# Patient Record
Sex: Female | Born: 1990 | Race: White | Hispanic: No | Marital: Married | Smoking: Former smoker
Health system: Southern US, Community
[De-identification: ages and names within clinical notes are randomized; demographics above are authoritative.]

## PROBLEM LIST (undated history)

## (undated) DIAGNOSIS — D649 Anemia, unspecified: Secondary | ICD-10-CM

## (undated) DIAGNOSIS — F329 Major depressive disorder, single episode, unspecified: Secondary | ICD-10-CM

## (undated) DIAGNOSIS — F32A Depression, unspecified: Secondary | ICD-10-CM

## (undated) DIAGNOSIS — U071 COVID-19: Secondary | ICD-10-CM

## (undated) DIAGNOSIS — I471 Supraventricular tachycardia: Secondary | ICD-10-CM

## (undated) DIAGNOSIS — Z789 Other specified health status: Secondary | ICD-10-CM

## (undated) HISTORY — PX: ABLATION: SHX5711

## (undated) HISTORY — DX: Other specified health status: Z78.9

## (undated) HISTORY — DX: Supraventricular tachycardia: I47.1

---
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
## 2015-08-08 DIAGNOSIS — I471 Supraventricular tachycardia, unspecified: Secondary | ICD-10-CM

## 2015-08-08 HISTORY — DX: Supraventricular tachycardia: I47.1

## 2015-08-08 HISTORY — PX: CHOLECYSTECTOMY: SHX55

## 2015-08-08 HISTORY — DX: Supraventricular tachycardia, unspecified: I47.10

## 2017-01-25 DIAGNOSIS — O365990 Maternal care for other known or suspected poor fetal growth, unspecified trimester, not applicable or unspecified: Secondary | ICD-10-CM

## 2018-08-07 NOTE — L&D Delivery Note (Addendum)
Delivery Note At 10:26 AM a viable and healthy female was delivered via Vaginal, Spontaneous (Presentation: LOA ).  APGAR: 9, 9; weight per infant chart Placenta status: delivered spontaneously intact  Cord: 3-vessel  Anesthesia:Epidural Episiotomy: None Lacerations:  1st degree perineal Suture Repair: 3.0 vicryl Est. Blood Loss (mL):  25  Mom to postpartum.  Baby to Couplet care / Skin to Skin.  Arby Barrette E Driver 0/27/2536, 64:40 AM  OB FELLOW DELIVERY ATTESTATION  I was gloved and present for the delivery in its entirety, and I agree with the above resident's note.    Barrington Ellison, MD [facility] Family Medicine Fellow, [facility] for [employer], [place]

## 2018-12-02 ENCOUNTER — Encounter: Payer: Self-pay | Admitting: Obstetrics and Gynecology

## 2018-12-02 ENCOUNTER — Encounter: Payer: Self-pay | Admitting: Radiology

## 2018-12-02 ENCOUNTER — Other Ambulatory Visit (HOSPITAL_COMMUNITY)
Admission: RE | Admit: 2018-12-02 | Discharge: 2018-12-02 | Disposition: A | Discharge status: Home / Self Care | Payer: Medicaid Other | Source: Ambulatory Visit | Attending: Obstetrics and Gynecology | Admitting: Obstetrics and Gynecology

## 2018-12-02 ENCOUNTER — Encounter: Payer: Self-pay | Admitting: *Deleted

## 2018-12-02 ENCOUNTER — Ambulatory Visit (INDEPENDENT_AMBULATORY_CARE_PROVIDER_SITE_OTHER): Payer: Medicaid Other | Admitting: Obstetrics and Gynecology

## 2018-12-02 ENCOUNTER — Other Ambulatory Visit: Payer: Self-pay

## 2018-12-02 VITALS — BP 124/78 | HR 116 | Ht 67.0 in | Wt 139.0 lb

## 2018-12-02 DIAGNOSIS — M249 Joint derangement, unspecified: Secondary | ICD-10-CM | POA: Insufficient documentation

## 2018-12-02 DIAGNOSIS — B3731 Acute candidiasis of vulva and vagina: Secondary | ICD-10-CM

## 2018-12-02 DIAGNOSIS — Z3480 Encounter for supervision of other normal pregnancy, unspecified trimester: Secondary | ICD-10-CM

## 2018-12-02 DIAGNOSIS — Z800 Family history of malignant neoplasm of digestive organs: Secondary | ICD-10-CM | POA: Insufficient documentation

## 2018-12-02 DIAGNOSIS — Z3A16 16 weeks gestation of pregnancy: Secondary | ICD-10-CM | POA: Diagnosis not present

## 2018-12-02 DIAGNOSIS — Z3482 Encounter for supervision of other normal pregnancy, second trimester: Secondary | ICD-10-CM | POA: Diagnosis not present

## 2018-12-02 DIAGNOSIS — B373 Candidiasis of vulva and vagina: Secondary | ICD-10-CM

## 2018-12-02 DIAGNOSIS — Z87898 Personal history of other specified conditions: Secondary | ICD-10-CM | POA: Insufficient documentation

## 2018-12-02 MED ORDER — CLOTRIMAZOLE 1 % EX OINT: TOPICAL_OINTMENT | CUTANEOUS | 1 refills | Status: DC

## 2018-12-02 MED ORDER — METRONIDAZOLE 500 MG PO TABS: 500.0000 mg | ORAL_TABLET | Freq: Two times a day (BID) | ORAL | 0 refills | Status: AC

## 2018-12-02 NOTE — Progress Notes (Signed)
Erroneous encounter

## 2018-12-02 NOTE — Progress Notes (Signed)
Moved her from [place] few months ago Last Pap 2018-normal

## 2018-12-02 NOTE — Patient Instructions (Signed)
Start taking a quarter cup of Kefir right before bed.

## 2018-12-02 NOTE — Progress Notes (Signed)
New OB Note  12/02/2018   [facility]  Chief Complaint: NOB  Transfer of Care Patient: no  History of Present Illness: [name] is a 28 y.o. G3P1011 @ 16/6 weeks (EDC 106, based on Patient's last menstrual period was 08/06/2018 (exact date).=6wk u/s).  Preg complicated by has Family history of Lynch syndrome; Hypermobile joints; History of poor fetal growth; and Supervision of high risk pregnancy, antepartum on their problem list.   Any events prior to today's visit: no Her periods were: regular, qmonth She was using no method when she conceived.  She has Negative signs or symptoms of nausea/vomiting of pregnancy. She has Negative signs or symptoms of miscarriage or preterm labor  ROS: A 12-point review of systems was performed and negative, except as stated in the above HPI.  OBGYN History: As per HPI. OB History  Gravida Para Term Preterm AB Living  3 1 1   1 1   SAB TAB Ectopic Multiple Live Births  1       1    # Outcome Date GA Lbr Len/2nd Weight Sex Delivery Anes PTL Lv  3 Current           2 Term 01/25/17 3923w0d  5 lb 14 oz (2.665 kg) F Vag-Spont   LIV     Complications: IUGR (intrauterine growth restriction) affecting care of mother  1 SAB 12/2015            Any issues with any prior pregnancies: yes Prior children are healthy, doing well, and without any problems or issues: yes History of pap smears: Yes. Last pap smear ?2018 and results were negative   Past Medical History: Past Medical History:  Diagnosis Date  . Medical history non-contributory   . SVT (supraventricular tachycardia) (HCC) 2017    Past Surgical History: Past Surgical History:  Procedure Laterality Date  . ABLATION     SVT  . CHOLECYSTECTOMY  2017    Family History:  History reviewed. No pertinent family history. She denies any history of mental retardation, birth defects or genetic disorders in her or the FOB's history  Social History:  Social  History   Socioeconomic History  . Marital status: Married    Spouse name: Not on file  . Number of children: Not on file  . Years of education: Not on file  . Highest education level: Not on file  Occupational History  . Not on file  Social Needs  . Financial resource strain: Not on file  . Food insecurity:    Worry: Not on file    Inability: Not on file  . Transportation needs:    Medical: Not on file    Non-medical: Not on file  Tobacco Use  . Smoking status: Never Smoker  . Smokeless tobacco: Never Used  Substance and Sexual Activity  . Alcohol use: Not Currently  . Drug use: Not Currently  . Sexual activity: Yes    Birth control/protection: None  Lifestyle  . Physical activity:    Days per week: Not on file    Minutes per session: Not on file  . Stress: Not on file  Relationships  . Social connections:    Talks on phone: Not on file    Gets together: Not on file    Attends religious service: Not on file    Active member of club or organization: Not on file    Attends meetings of clubs or organizations: Not on file  Relationship status: Not on file  . Intimate partner violence:    Fear of current or ex partner: Not on file    Emotionally abused: Not on file    Physically abused: Not on file    Forced sexual activity: Not on file  Other Topics Concern  . Not on file  Social History Narrative  . Not on file    Allergy: No Known Allergies  Health Maintenance:  Mammogram Up to Date: not applicable  Current Outpatient Medications: PNV  Physical Exam:   BP 124/78   Pulse (!) 116   Ht 5\' 7"  (1.702 m)   Wt 139 lb (63 kg)   LMP 08/06/2018 (Exact Date)   BMI 21.77 kg/m  Body mass index is 21.77 kg/m. Contractions: Not present Vag. Bleeding: None. Fundal height: not applicable FHTs: 160s  General appearance: Well nourished, well developed female in no acute distress.  Neck:  Supple, normal appearance, and no thyromegaly  Cardiovascular: S1, S2  normal, no murmur, rub or gallop, regular rate and rhythm Respiratory:  Clear to auscultation bilateral. Normal respiratory effort Abdomen: positive bowel sounds and no masses, hernias; diffusely non tender to palpation, non distended Breasts: no breast s/s. Neuro/Psych:  Normal mood and affect.  Skin:  Warm and dry.  Lymphatic:  No inguinal lymphadenopathy.   Pelvic exam: is not limited by body habitus EGBUS: within normal limits, Vagina: within normal limits and with no blood in the vault, Cervix: normal appearing cervix without discharge or lesions, closed/long/high, Uterus:  enlarged, c/w 16-18 week size, and Adnexa:  normal adnexa and no mass, fullness, tenderness  Laboratory: none  Imaging:  none  Assessment: pt doing well  Plan: 1. Supervision of other normal pregnancy, antepartum Routine care. U/s per patient report pregnancy care center. F/u with anatomy u/s.  - Obstetric Panel, Including HIV - Culture, OB Urine - Enroll Patient in Babyscripts - Babyscripts Schedule Optimization - [place] MFM OB DETAIL +14 WK; Future - Cytology - PAP( [place]) - Ambulatory referral to Physical Therapy - Hepatitis C Antibody  2. Family history of Lynch syndrome Patient states she tested negative  3. Hypermobile joints Mostly hips and shoulders. patient states she was told that she doesn't have marfan's or EDS earlier in life. She states she needed PT throughout her last pregnancy. Referral sent for patient to establish care with PT  4. History of poor fetal growth Recommend at least starting growth u/s at 32-34wks  2665gm 38wk delivery. Sounds like she had FGR with abnormal dopplers as indication for delivery.   5. Vulvovaginal candidiasis Ointment sent in  Problem list reviewed and updated.  Follow up in 4 weeks.  >50% of 25 min visit spent on counseling and coordination of care.     Cornelia Copa MD Attending Center for [facility] PT  during pregnancy

## 2018-12-03 LAB — OBSTETRIC PANEL, INCLUDING HIV
Antibody Screen: NEGATIVE
Basophils Absolute: 0 10*3/uL (ref 0.0–0.2)
Basos: 0 %
EOS (ABSOLUTE): 0 10*3/uL (ref 0.0–0.4)
Eos: 0 %
HIV Screen 4th Generation wRfx: NONREACTIVE
Hematocrit: 39 % (ref 34.0–46.6)
Hemoglobin: 13.4 g/dL (ref 11.1–15.9)
Hepatitis B Surface Ag: NEGATIVE
Immature Grans (Abs): 0 10*3/uL (ref 0.0–0.1)
Immature Granulocytes: 0 %
Lymphocytes Absolute: 1.3 10*3/uL (ref 0.7–3.1)
Lymphs: 15 %
MCH: 30.5 pg (ref 26.6–33.0)
MCHC: 34.4 g/dL (ref 31.5–35.7)
MCV: 89 fL (ref 79–97)
Monocytes Absolute: 0.5 10*3/uL (ref 0.1–0.9)
Monocytes: 5 %
Neutrophils Absolute: 7.1 10*3/uL — ABNORMAL HIGH (ref 1.4–7.0)
Neutrophils: 80 %
Platelets: 221 10*3/uL (ref 150–450)
RBC: 4.39 x10E6/uL (ref 3.77–5.28)
RDW: 12.5 % (ref 11.7–15.4)
RPR Ser Ql: NONREACTIVE
Rh Factor: POSITIVE
Rubella Antibodies, IGG: 5.61 index (ref >0.99)
WBC: 9 10*3/uL (ref 3.4–10.8)

## 2018-12-03 LAB — CYTOLOGY - PAP
Chlamydia: NEGATIVE
Diagnosis: NEGATIVE
Neisseria Gonorrhea: NEGATIVE
Trichomonas: NEGATIVE

## 2018-12-03 LAB — HEPATITIS C ANTIBODY: Hep C Virus Ab: 0.2 s/co ratio (ref 0.0–0.9)

## 2018-12-04 ENCOUNTER — Telehealth: Payer: Self-pay | Admitting: *Deleted

## 2018-12-04 NOTE — Telephone Encounter (Signed)
Received fax from pharmacy that alevazol ointment is unavailable/backorder. Per Dr Vergie Living okay to switch to one that is available. RX called in  Tioconazole ointment 1 tube apply bid x 7 days, and one refill.

## 2018-12-05 ENCOUNTER — Encounter: Payer: Self-pay | Admitting: Obstetrics and Gynecology

## 2018-12-05 DIAGNOSIS — O0990 Supervision of high risk pregnancy, unspecified, unspecified trimester: Secondary | ICD-10-CM | POA: Insufficient documentation

## 2018-12-07 LAB — CULTURE, OB URINE

## 2018-12-07 LAB — URINE CULTURE, OB REFLEX

## 2018-12-09 ENCOUNTER — Encounter: Payer: Self-pay | Admitting: Obstetrics and Gynecology

## 2018-12-09 ENCOUNTER — Telehealth: Payer: Self-pay | Admitting: *Deleted

## 2018-12-09 DIAGNOSIS — R8271 Bacteriuria: Secondary | ICD-10-CM | POA: Insufficient documentation

## 2018-12-09 MED ORDER — NITROFURANTOIN MONOHYD MACRO 100 MG PO CAPS: ORAL_CAPSULE | ORAL | 0 refills | Status: DC

## 2018-12-09 NOTE — Addendum Note (Signed)
Addended by: [employer] on: 12/09/2018 09:38 AM   Modules accepted: Orders

## 2018-12-09 NOTE — Telephone Encounter (Signed)
Pt informed of results and RX being sent in.

## 2018-12-09 NOTE — Telephone Encounter (Signed)
-----   Message from [employer], MD sent at 12/09/2018  9:39 AM EDT ----- Can you let her know that I sent in an abx for her UTI? thanks

## 2018-12-23 ENCOUNTER — Other Ambulatory Visit (HOSPITAL_COMMUNITY): Payer: Self-pay | Admitting: *Deleted

## 2018-12-23 ENCOUNTER — Ambulatory Visit (HOSPITAL_COMMUNITY)
Admission: RE | Admit: 2018-12-23 | Discharge: 2018-12-23 | Disposition: A | Discharge status: Home / Self Care | Payer: Medicaid Other | Source: Ambulatory Visit | Attending: Obstetrics and Gynecology | Admitting: Obstetrics and Gynecology

## 2018-12-23 ENCOUNTER — Other Ambulatory Visit: Payer: Self-pay

## 2018-12-23 DIAGNOSIS — Z3480 Encounter for supervision of other normal pregnancy, unspecified trimester: Secondary | ICD-10-CM | POA: Diagnosis present

## 2018-12-23 DIAGNOSIS — Z3A19 19 weeks gestation of pregnancy: Secondary | ICD-10-CM

## 2018-12-23 DIAGNOSIS — Z363 Encounter for antenatal screening for malformations: Secondary | ICD-10-CM | POA: Diagnosis not present

## 2018-12-23 DIAGNOSIS — Z8489 Family history of other specified conditions: Secondary | ICD-10-CM | POA: Diagnosis not present

## 2018-12-23 DIAGNOSIS — O09292 Supervision of pregnancy with other poor reproductive or obstetric history, second trimester: Secondary | ICD-10-CM | POA: Diagnosis not present

## 2018-12-23 DIAGNOSIS — O365930 Maternal care for other known or suspected poor fetal growth, third trimester, not applicable or unspecified: Secondary | ICD-10-CM

## 2018-12-25 ENCOUNTER — Ambulatory Visit: Payer: Medicaid Other | Attending: Obstetrics and Gynecology | Admitting: Physical Therapy

## 2018-12-25 ENCOUNTER — Other Ambulatory Visit: Payer: Self-pay

## 2018-12-25 ENCOUNTER — Encounter: Payer: Self-pay | Admitting: Physical Therapy

## 2018-12-25 DIAGNOSIS — R279 Unspecified lack of coordination: Secondary | ICD-10-CM | POA: Insufficient documentation

## 2018-12-25 DIAGNOSIS — M6281 Muscle weakness (generalized): Secondary | ICD-10-CM | POA: Diagnosis present

## 2018-12-25 NOTE — Patient Instructions (Signed)
Access Code: [email or web address]  URL: [email or web address]  Date: 12/25/2018  Prepared by: Dwana Curd   Exercises  Quadruped Alternating Arm Lift - 10 reps - 2 sets - 1x daily - 7x weekly

## 2018-12-25 NOTE — Therapy (Signed)
[facility] [address] [place], [place], [address] Phone: [phone]   Fax:  [phone]  Physical Therapy Evaluation  Patient Details  Name: Theresa Johnston MRN: [id number] Date of Birth: Apr 27, 1991 Referring Provider (PT): Aletha Halim, MD   Encounter Date: 12/25/2018  PT End of Session - 12/25/18 0921    Visit Number  1    Date for PT Re-Evaluation  03/19/19    Authorization Type  medicaid    PT Start Time  0915    PT Stop Time  0949    PT Time Calculation (min)  34 min    Activity Tolerance  Patient tolerated treatment well    Behavior During Therapy  [facility] for tasks assessed/performed       Past Medical History:  Diagnosis Date  . Medical history non-contributory   . SVT (supraventricular tachycardia) ([place]) 2017    Past Surgical History:  Procedure Laterality Date  . ABLATION     SVT  . CHOLECYSTECTOMY  2017    There were no vitals filed for this visit.   Subjective Assessment - 12/25/18 0930    Subjective  I have loose joints and had to do exercises througout my entire pregnancy to hold everything together.    Limitations  Walking;Standing;Lifting    Patient Stated Goals  be able to have a healthy pregnancy, reduced low back pain    Currently in Pain?  Yes    Pain Score  6    when doing a lot 1-2 days/week   Pain Location  Back    Pain Orientation  Lower    Pain Descriptors / Indicators  Sharp;Aching    Pain Type  Acute pain;Chronic pain   very rarely had it, but more since pregnant   Pain Radiating Towards  into the hips    Pain Onset  More than a month ago    Pain Frequency  Several days a week    Aggravating Factors   lifting and walking/standing a lot    Pain Relieving Factors  massage    Effect of Pain on Daily Activities  hurts more when doing more    Multiple Pain Sites  No         [facility] PT Assessment - 12/25/18 0001      Assessment   Medical Diagnosis  Z34.80  (ICD-10-CM) - Supervision of other normal pregnancy, antepartum; B37.3 (ICD-10-CM) - Vulvovaginal candidiasis    Referring Provider (PT)  Aletha Halim, MD    Onset Date/Surgical Date  --   20 weeks ago   Prior Therapy  yes when pregnant with her daughter      Precautions   Precautions  None      Restrictions   Weight Bearing Restrictions  No      Balance Screen   Has the patient fallen in the past 6 months  No      [place] residence    Living Arrangements  Spouse/significant other;Children   daughter 2 yo     Prior Function   Level of Independence  Independent      Cognition   Overall Cognitive Status  Within Functional Limits for tasks assessed      Posture/Postural Control   Posture/Postural Control  Postural limitations    Postural Limitations  Decreased lumbar lordosis;Right pelvic obliquity;Anterior pelvic tilt      ROM / Strength   AROM / PROM / Strength  AROM;Strength  Strength   Overall Strength Comments  core 4/5 strength    Strength Assessment Site  Hip    Right/Left Hip  Right;Left    Right Hip Extension  4/5    Right Hip ABduction  5/5    Right Hip ADduction  4+/5    Left Hip Extension  4+/5    Left Hip ADduction  4-/5      Flexibility   Soft Tissue Assessment /Muscle Length  yes    Hamstrings  10% limited      Palpation   Palpation comment  left SI joint tenderness      Special Tests    Special Tests  Lumbar    Lumbar Tests  Straight Leg Raise      Straight Leg Raise   Findings  --   some pain on left SI joint   Side   Left    Comment  easier and no pain with compression      Ambulation/Gait   Gait Pattern  Within Functional Limits                Objective measurements completed on examination: See above findings.    Pelvic Floor Special Questions - 12/25/18 0001    Prior Pelvic/Prostate Exam  Yes    Are you Pregnant or attempting pregnancy?  Yes   pregnant [redacted] weeks   Prior  Pregnancies  Yes    Number of Pregnancies  2    Number of Vaginal Deliveries  1    Any difficulty with labor and deliveries  No    Currently Sexually Active  Yes    Is this Painful  Yes    Marinoff Scale  pain interrupts completion    Urinary Leakage  Yes    How often  daily - very small amount    Pad use  panty liner    Activities that cause leaking  Coughing;Sneezing;Laughing    Urinary urgency  No    Falling out feeling (prolapse)  No    Exam Type  Deferred       [facility] Adult PT Treatment/Exercise - 12/25/18 0001      Self-Care   Self-Care  Other Self-Care Comments    Other Self-Care Comments   initial HEP             PT Education - 12/25/18 [place]    Education Details   Access Code: [email or web address]     Person(s) Educated  Patient    Methods  Explanation;Demonstration;Verbal cues;Handout    Comprehension  Verbalized understanding;Returned demonstration       PT Short Term Goals - 12/25/18 0958      PT SHORT TERM GOAL #1   Title  ind with initial HEP    Time  4    Period  Weeks    Status  New    Target Date  01/22/19      PT SHORT TERM GOAL #2   Title  Pt will be able to correctly engage core and pelvic floor when lifting her daughter    Time  4    Period  Weeks    Status  New    Target Date  01/22/19        PT Long Term Goals - 12/25/18 0953      PT LONG TERM GOAL #1   Title  Pt will be ind with HEP for strengthening exercises for healthy pregnancy    Time  12    Period  Weeks  Status  New    Target Date  03/19/19      PT LONG TERM GOAL #2   Title  Pt will be able to take walks with her family without increased back pain due to improved strength and pain management techniques    Baseline  6/10 pain on NPRS    Time  12    Period  Weeks    Status  New    Target Date  03/19/19      PT LONG TERM GOAL #3   Title  Pt will report back pain of 4/10 at most even on days with increased activity    Baseline  6/10 on NPRS    Time  12    Period  Weeks     Status  Not Met    Target Date  03/19/19      PT LONG TERM GOAL #4   Title  Pt will be able to lift her daughter without low back pain due to improved ability to engage core and pelvic floor    Baseline  has back pain up to 6/10 on NPRS    Time  12    Period  Weeks    Status  New    Target Date  03/19/19             Plan - 12/25/18 1002    Clinical Impression Statement  Pt presents to clinic today at [redacted] weeks gestation.  she has ligament laxity condition that causes her hips and pelvis to become out of alignment.   Pt is needing a strengthening routine to minimize effects of pregnancy and ensure safe and healthy pregnancy.  Pt demonstrates LE weakness and core weakness.  She has posture abnormalities as mentioned above with sacral rotation to the Rt.  Pt will benefit from skilled PT to address impairments and reduce low back pain so she can continue to care for her family with less risk and continue healthy pregnancy.    Personal Factors and Comorbidities  Age;Comorbidity 2;Time since onset of injury/illness/exacerbation    Examination-Activity Limitations  Lift;Stand    Examination-Participation Restrictions  Other    Stability/Clinical Decision Making  Evolving/Moderate complexity    Rehab Potential  Excellent    PT Frequency  2x / week    PT Duration  12 weeks    PT Treatment/Interventions  ADLs/Self Care Home Management;Biofeedback;Therapeutic activities;Therapeutic exercise;Neuromuscular re-education;Patient/family education;Manual techniques;Passive range of motion;Taping;Electrical Stimulation;Moist Heat;Cryotherapy    PT Next Visit Plan  core and pelvic floor strength, adductor and hip strength    PT Home Exercise Plan   Access Code: [email or web address]     Consulted and Agree with Plan of Care  Patient       Patient will benefit from skilled therapeutic intervention in order to improve the following deficits and impairments:  Postural dysfunction, Difficulty walking, Decreased  coordination, Pain, Decreased strength  Visit Diagnosis: Muscle weakness (generalized)  Unspecified lack of coordination     Problem List Patient Active Problem List   Diagnosis Date Noted  . GBS bacteriuria 12/09/2018  . Supervision of high risk pregnancy, antepartum 12/05/2018  . Family history of Lynch syndrome 12/02/2018  . 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 History of poor fetal growth 12/02/2018    Jule Ser, PT 12/25/2018, 10:20 AM  [facility] [address], [place], [place], [address] Phone: [phone]   Fax:  [phone]  Name: Theresa Johnston MRN: [id number] Date of Birth: 12-07-1990

## 2018-12-31 ENCOUNTER — Ambulatory Visit (INDEPENDENT_AMBULATORY_CARE_PROVIDER_SITE_OTHER): Payer: Medicaid Other | Admitting: Family Medicine

## 2018-12-31 VITALS — BP 120/89 | Wt 141.0 lb

## 2018-12-31 DIAGNOSIS — Z3A21 21 weeks gestation of pregnancy: Secondary | ICD-10-CM

## 2018-12-31 DIAGNOSIS — O0992 Supervision of high risk pregnancy, unspecified, second trimester: Secondary | ICD-10-CM

## 2018-12-31 DIAGNOSIS — O0990 Supervision of high risk pregnancy, unspecified, unspecified trimester: Secondary | ICD-10-CM

## 2018-12-31 NOTE — Progress Notes (Signed)
TELEHEALTH OBSTETRICS PRENATAL VIRTUAL VIDEO VISIT ENCOUNTER NOTE  Provider location: Center for [facility] at [facility]   I connected with Theresa Johnston on 12/31/18 at  1:15 PM EDT by WebEx Encounter at home and verified that I am speaking with the correct person using two identifiers.   I discussed the limitations, risks, security and privacy concerns of performing an evaluation and management service by telephone and the availability of in person appointments. I also discussed with the patient that there may be a patient responsible charge related to this service. The patient expressed understanding and agreed to proceed. Subjective:  Theresa Johnston is a 28 y.o. G3P1011 at [redacted]w[redacted]d being seen today for ongoing prenatal care.  She is currently monitored for the following issues for this low-risk pregnancy and has Family history of Lynch syndrome; Hypermobile joints; History of poor fetal growth; Supervision of high risk pregnancy, antepartum; and GBS bacteriuria on their problem list.  Patient reports no complaints.  Contractions: Irregular.  .  Movement: Present. Denies any leaking of fluid.   The following portions of the patient's history were reviewed and updated as appropriate: allergies, current medications, past family history, past medical history, past social history, past surgical history and problem list.   Objective:   Vitals:   12/31/18 1339  BP: 120/89  Weight: 141 lb (64 kg)    Fetal Status:     Movement: Present     General:  Alert, oriented and cooperative. Patient is in no acute distress.  Respiratory: Normal respiratory effort, no problems with respiration noted  Mental Status: Normal mood and affect. Normal behavior. Normal judgment and thought content.  Rest of physical exam deferred due to type of encounter  Imaging: [place] Mfm Ob Detail +14 Wk  Result Date: 12/23/2018 ----------------------------------------------------------------------  OBSTETRICS  REPORT                       (Signed Final 12/23/2018 02:49 pm) ---------------------------------------------------------------------- Patient Info  ID #:       [id number]                          D.O.B.:  [birth year] (28 yrs)  Name:       Theresa Johnston                 Visit Date: 12/23/2018 08:22 am ---------------------------------------------------------------------- Performed By  Performed By:     Percell Boston          Ref. Address:      [address]                    [address]                                                              [address]  Attending:        Lin Landsman      [facility]  Referred By:      [facility] ---------------------------------------------------------------------- Orders   #  Description                          Code         Ordered By   1  [place] MFM OB DETAIL +14 WK              76811.01     [place]  ----------------------------------------------------------------------   #  Order #                    Accession #                 Episode #   1  [id number]                  [id number]                  [id number]  ---------------------------------------------------------------------- Indications   [redacted] weeks gestation of pregnancy                Z3A.19   Encounter for antenatal screening for          Z36.3   malformations (no testing in chart)   Poor obstetric history: Previous fetal growth  O09.299   restriction (FGR)   Family history of genetic disorder (lynch      Z84.89   syndrome and hypermobility)  ---------------------------------------------------------------------- Fetal Evaluation  Num Of Fetuses:          1  Fetal Heart Rate(bpm):   148  Cardiac Activity:        Observed  Presentation:            Cephalic  Placenta:                Anterior  P. Cord Insertion:       Visualized, central  Amniotic Fluid  AFI FV:      Within normal limits                              Largest  Pocket(cm)                              3.0 ---------------------------------------------------------------------- Biometry  BPD:      44.5  mm     G. Age:  19w 3d         33  %    CI:          74.3  %    70 - 86                                                          FL/HC:       18.1  %    16.8 - 19.8  HC:      163.9  mm     G. Age:  19w 1d         14  %    HC/AC:       1.15       1.09 - 1.39  AC:      142.9  mm  G. Age:  19w 4d         38  %    FL/BPD:      66.5  %  FL:       29.6  mm     G. Age:  19w 1d         19  %    FL/AC:       20.7  %    20 - 24  HUM:      29.1  mm     G. Age:  19w 3d         43  %  CER:      18.4  mm     G. Age:  18w 1d        < 5  %  NFT:       4.2  mm  CM:          6  mm  Est. FW:     289   gm   0 lb 10 oz      39  % ---------------------------------------------------------------------- OB History  Gravidity:    3         Term:   1        Prem:   0        SAB:   1  TOP:          0       Ectopic:  0        Living: 1 ---------------------------------------------------------------------- Gestational Age  LMP:           19w 6d        Date:  08/06/18                 EDD:   05/13/19  U/S Today:     19w 2d                                        EDD:   05/17/19  Best:          19w 6d     Det. By:  LMP  (08/06/18)          EDD:   05/13/19 ---------------------------------------------------------------------- Anatomy  Cranium:               Appears normal         LVOT:                   Appears normal  Cavum:                 Appears normal         Aortic Arch:            Appears normal  Ventricles:            Appears normal         Ductal Arch:            Appears normal  Choroid Plexus:        Appears normal         Diaphragm:              Appears normal  Cerebellum:            Appears normal         Stomach:  Appears normal, left                                                                        sided  Posterior Fossa:       Appears normal         Abdomen:                 Appears normal  Nuchal Fold:           Appears normal         Abdominal Wall:         Appears nml (cord                                                                        insert, abd wall)  Face:                  Appears normal         Cord Vessels:           Appears normal (3                         (orbits and profile)                           vessel cord)  Lips:                  Appears normal         Kidneys:                Appear normal  Palate:                Appears normal         Bladder:                Appears normal  Thoracic:              Appears normal         Spine:                  Appears normal  Heart:                 Appears normal         Upper Extremities:      Appears normal                         (4CH, axis, and                         situs)  RVOT:                  Appears normal         Lower Extremities:      Appears normal  Other:  Parents do not wish to know sex of fetus. 3VV visualized.  Technically          difficult due to fetal position. Heels and 5th digit visualized. ---------------------------------------------------------------------- Cervix Uterus Adnexa  Cervix  Length:            3.3  cm.  Normal appearance by transabdominal scan.  Uterus  No abnormality visualized.  Left Ovary  No adnexal mass visualized.  Right Ovary  No adnexal mass visualized.  Cul De Sac  No free fluid seen.  Adnexa  No abnormality visualized. ---------------------------------------------------------------------- Impression  Normal interval growth.  No ultrasonic evidence of structural  fetal anomalies.  Prior history of IUGR ---------------------------------------------------------------------- Recommendations  Follow up growth at [redacted] weeks gestation. ----------------------------------------------------------------------               Lin Landsman, MD Electronically Signed Final Report   12/23/2018 02:49 pm ----------------------------------------------------------------------   Assessment and  Plan:  Pregnancy: G3P1011 at [redacted]w[redacted]d 1. Supervision of high risk pregnancy, antepartum Normal anatomy reviewed  Preterm labor symptoms and general obstetric precautions including but not limited to vaginal bleeding, contractions, leaking of fluid and fetal movement were reviewed in detail with the patient. I discussed the assessment and treatment plan with the patient. The patient was provided an opportunity to ask questions and all were answered. The patient agreed with the plan and demonstrated an understanding of the instructions. The patient was advised to call back or seek an in-person office evaluation/go to [facility] at [facility] for any urgent or concerning symptoms. Please refer to After Visit Summary for other counseling recommendations.   I provided 15 minutes of face-to-face time during this encounter.  Return in about 4 weeks (around 01/28/2019) for virtually, 7 wks in person with 28 wk labs and GTT.  Future Appointments  Date Time Provider Department Center  03/17/2019 10:15 AM WH-MFC [place] 4 WH-MFCUS MFC-US    Reva Bores, MD Center for [facility], [facility]

## 2018-12-31 NOTE — Progress Notes (Signed)
I connected with  Theresa Johnston on 12/31/18 at  1:15 PM EDT by telephone and verified that I am speaking with the correct person using two identifiers.   I discussed the limitations, risks, security and privacy concerns of performing an evaluation and management service by telephone and the availability of in person appointments. I also discussed with the patient that there may be a patient responsible charge related to this service. The patient expressed understanding and agreed to proceed.  Theresa Johnston, CMA 12/31/2018  1:13 PM

## 2019-01-09 ENCOUNTER — Other Ambulatory Visit: Payer: Self-pay

## 2019-01-09 ENCOUNTER — Ambulatory Visit: Payer: Medicaid Other | Attending: Obstetrics and Gynecology | Admitting: Physical Therapy

## 2019-01-09 ENCOUNTER — Encounter: Payer: Self-pay | Admitting: Physical Therapy

## 2019-01-09 DIAGNOSIS — M6281 Muscle weakness (generalized): Secondary | ICD-10-CM | POA: Diagnosis present

## 2019-01-09 DIAGNOSIS — R279 Unspecified lack of coordination: Secondary | ICD-10-CM

## 2019-01-09 NOTE — Therapy (Signed)
[facility] [address], [place], [place], [address] Phone: [phone]   Fax:  [phone]  Physical Therapy Treatment  Patient Details  Name: Theresa Johnston MRN: [id number] Date of Birth: 1991/03/14 Referring Provider (PT): Aletha Halim, MD   Encounter Date: 01/09/2019  PT End of Session - 01/09/19 1502    Visit Number  2    Date for PT Re-Evaluation  03/19/19    Authorization Type  medicaid    PT Start Time  1502    PT Stop Time  1550    PT Time Calculation (min)  48 min    Activity Tolerance  Patient tolerated treatment well    Behavior During Therapy  [facility] for tasks assessed/performed       Past Medical History:  Diagnosis Date  . Medical history non-contributory   . SVT (supraventricular tachycardia) ([place]) 2017    Past Surgical History:  Procedure Laterality Date  . ABLATION     SVT  . CHOLECYSTECTOMY  2017    There were no vitals filed for this visit.  Subjective Assessment - 01/09/19 1509    Subjective  I haven't noticed any pain, but I haven't been as active.  Everything is going well so far.                         [facility] Adult PT Treatment/Exercise - 01/09/19 0001      Neuro Re-ed    Neuro Re-ed Details   seated on ball - pelvic clocks - 20x with kegel and bracing      Exercises   Exercises  Lumbar      Lumbar Exercises: Stretches   Active Hamstring Stretch  Right;Left;2 reps;20 seconds      Lumbar Exercises: Standing   Row  Strengthening;Both;20 reps;Theraband    Theraband Level (Row)  Level 1 (Yellow)    Shoulder Extension  Strengthening;Both;20 reps;Theraband    Theraband Level (Shoulder Extension)  Level 2 (Red)      Lumbar Exercises: Seated   Long Arc Quad on Chair  Strengthening;Both;10 reps   ball squeeze   Other Seated Lumbar Exercises  seated on ball: shoulder flexion 2lb, band pulls diagonal both ways - 20x      Lumbar Exercises: Supine   Bridge   10 reps;5 seconds   with kegel and abdominal bracing   Other Supine Lumbar Exercises  bent knee drop outs - 10x each side      Lumbar Exercises: Sidelying   Clam  Right;Left;10 reps;5 seconds   kegel and               PT Short Term Goals - 01/09/19 1610      PT SHORT TERM GOAL #1   Title  ind with initial HEP    Status  Achieved      PT SHORT TERM GOAL #2   Title  Pt will be able to correctly engage core and pelvic floor when lifting her daughter    Status  On-going        PT Long Term Goals - 12/25/18 0953      PT LONG TERM GOAL #1   Title  Pt will be ind with HEP for strengthening exercises for healthy pregnancy    Time  12    Period  Weeks    Status  New    Target Date  03/19/19      PT LONG TERM GOAL #2  Title  Pt will be able to take walks with her family without increased back pain due to improved strength and pain management techniques    Baseline  6/10 pain on NPRS    Time  12    Period  Weeks    Status  New    Target Date  03/19/19      PT LONG TERM GOAL #3   Title  Pt will report back pain of 4/10 at most even on days with increased activity    Baseline  6/10 on NPRS    Time  12    Period  Weeks    Status  Not Met    Target Date  03/19/19      PT LONG TERM GOAL #4   Title  Pt will be able to lift her daughter without low back pain due to improved ability to engage core and pelvic floor    Baseline  has back pain up to 6/10 on NPRS    Time  12    Period  Weeks    Status  New    Target Date  03/19/19            Plan - 01/09/19 1605    Clinical Impression Statement  Pt was able to progress her HEP.  She needed cues to lengthen lumbar spine and and engage core to lift belly.  Pt did well with cueing.  She fatigues during the exercises, but overall reports feeling better after treatment.  Pt will benefit from skilled PT to progress strength for healthy pregnancy.    PT Treatment/Interventions  ADLs/Self Care Home  Management;Biofeedback;Therapeutic activities;Therapeutic exercise;Neuromuscular re-education;Patient/family education;Manual techniques;Passive range of motion;Taping;Electrical Stimulation;Moist Heat;Cryotherapy    PT Next Visit Plan  core and pelvic floor strength, adductor and hip strength    PT Home Exercise Plan   Access Code: [email or web address]     Consulted and Agree with Plan of Care  Patient       Patient will benefit from skilled therapeutic intervention in order to improve the following deficits and impairments:  Postural dysfunction, Difficulty walking, Decreased coordination, Pain, Decreased strength  Visit Diagnosis: Muscle weakness (generalized)  Unspecified lack of coordination     Problem List Patient Active Problem List   Diagnosis Date Noted  . GBS bacteriuria 12/09/2018  . Supervision of high risk pregnancy, antepartum 12/05/2018  . Family history of Lynch syndrome 12/02/2018  . 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 History of poor fetal growth 12/02/2018    Jule Ser, PT 01/09/2019, 4:11 PM   [facility] [address] [place], [place], [address] Phone: [phone]   Fax:  [phone]  Name: Johnathon Mittal MRN: [id number] Date of Birth: [birth year]

## 2019-01-14 ENCOUNTER — Ambulatory Visit: Payer: Medicaid Other | Admitting: Physical Therapy

## 2019-01-14 ENCOUNTER — Encounter: Payer: Self-pay | Admitting: Physical Therapy

## 2019-01-14 ENCOUNTER — Other Ambulatory Visit: Payer: Self-pay

## 2019-01-14 DIAGNOSIS — M6281 Muscle weakness (generalized): Secondary | ICD-10-CM

## 2019-01-14 DIAGNOSIS — R279 Unspecified lack of coordination: Secondary | ICD-10-CM

## 2019-01-14 NOTE — Therapy (Signed)
[facility] [address], [place], [place], [address] Phone: [phone]   Fax:  [phone]  Physical Therapy Treatment  Patient Details  Name: Theresa Johnston MRN: [id number] Date of Birth: Jan 04, 1991 Referring Provider (PT): Aletha Halim, MD   Encounter Date: 01/14/2019  PT End of Session - 01/14/19 1333    Visit Number  3    Date for PT Re-Evaluation  03/19/19    Authorization Type  medicaid    PT Start Time  1333    PT Stop Time  1414    PT Time Calculation (min)  41 min    Activity Tolerance  Patient tolerated treatment well    Behavior During Therapy  [facility] for tasks assessed/performed       Past Medical History:  Diagnosis Date  . Medical history non-contributory   . SVT (supraventricular tachycardia) ([place]) 2017    Past Surgical History:  Procedure Laterality Date  . ABLATION     SVT  . CHOLECYSTECTOMY  2017    There were no vitals filed for this visit.  Subjective Assessment - 01/14/19 1333    Subjective  The spot on the left hurt after the last visits. I was crawling into bed at 8/10 over the weekend.    Currently in Pain?  Yes    Pain Score  3     Pain Orientation  Left    Pain Descriptors / Indicators  Sore    Pain Type  Acute pain;Chronic pain    Pain Onset  More than a month ago    Multiple Pain Sites  No         [facility] PT Assessment - 01/14/19 0001      Posture/Postural Control   Postural Limitations  Anterior pelvic tilt;Increased lumbar lordosis   Left hip increased elevation     Strength   Right Hip Flexion  5/5    Right Hip Extension  4/5    Right Hip ABduction  5/5    Right Hip ADduction  4+/5    Left Hip Flexion  4+/5    Left Hip Extension  4+/5    Left Hip ABduction  4/5    Left Hip ADduction  4-/5                   [facility] Adult PT Treatment/Exercise - 01/14/19 0001      Lumbar Exercises: Standing   Other Standing Lumbar Exercises  slides back and  side - sliders with cues to keep pelvic tilt and hips level      Lumbar Exercises: Sidelying   Hip Abduction  Right;Left;20 reps      Manual Therapy   Manual Therapy  Soft tissue mobilization    Soft tissue mobilization  left glutes, left lumbar paraspinals, left QL                PT Short Term Goals - 01/09/19 1610      PT SHORT TERM GOAL #1   Title  ind with initial HEP    Status  Achieved      PT SHORT TERM GOAL #2   Title  Pt will be able to correctly engage core and pelvic floor when lifting her daughter    Status  On-going        PT Long Term Goals - 01/14/19 1337      PT LONG TERM GOAL #1   Title  Pt will be ind with HEP for strengthening  exercises for healthy pregnancy      PT LONG TERM GOAL #2   Title  Pt will be able to take walks with her family without increased back pain due to improved strength and pain management techniques    Baseline  8/10 at worst    Status  On-going      PT LONG TERM GOAL #3   Title  Pt will report back pain of 4/10 at most even on days with increased activity    Baseline  8/10 on NPRS    Status  On-going      PT LONG TERM GOAL #4   Title  Pt will be able to lift her daughter without low back pain due to improved ability to engage core and pelvic floor    Baseline  has back pain up to 8/10 on NPRS    Status  On-going            Plan - 01/14/19 1416    Clinical Impression Statement  Pt had a flare up of left hip/low back pain this weekend.  She was better today but still sore.  Pt has pain up to 8/10 and is not manageing her pain completely yet.  She demonstrates LE weakness Lt>Rt.  Pt has difficutly maintaining posture due to weakness in core and muscle spasms in left glutes and lumbar paraspinals.  Pt will benefit from skilled PT to continue to address muscle weakness and postural impairments to ensure a healthy pregnancy.    PT Treatment/Interventions  ADLs/Self Care Home Management;Biofeedback;Therapeutic  activities;Therapeutic exercise;Neuromuscular re-education;Patient/family education;Manual techniques;Passive range of motion;Taping;Electrical Stimulation;Moist Heat;Cryotherapy    PT Next Visit Plan  core and pelvic floor strength, adductor and hip strength    PT Home Exercise Plan   Access Code: [email or web address] add hip adduction next    Consulted and Agree with Plan of Care  Patient       Patient will benefit from skilled therapeutic intervention in order to improve the following deficits and impairments:  Postural dysfunction, Difficulty walking, Decreased coordination, Pain, Decreased strength  Visit Diagnosis: Muscle weakness (generalized)  Unspecified lack of coordination     Problem List Patient Active Problem List   Diagnosis Date Noted  . GBS bacteriuria 12/09/2018  . Supervision of high risk pregnancy, antepartum 12/05/2018  . Family history of Lynch syndrome 12/02/2018  . 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 History of poor fetal growth 12/02/2018    Junious SilkJakki L Arwin Bisceglia, PT 01/14/2019, 2:24 PM  [facility] [address], [address] [place], [place], [address] Phone: [phone]   Fax:  [phone]  Name: Theresa Johnston MRN: [id number] Date of Birth: 05/20/1991

## 2019-01-21 ENCOUNTER — Other Ambulatory Visit: Payer: Self-pay

## 2019-01-21 ENCOUNTER — Telehealth (INDEPENDENT_AMBULATORY_CARE_PROVIDER_SITE_OTHER): Payer: Medicaid Other | Admitting: Family Medicine

## 2019-01-21 VITALS — BP 114/76 | Wt 143.0 lb

## 2019-01-21 DIAGNOSIS — O0992 Supervision of high risk pregnancy, unspecified, second trimester: Secondary | ICD-10-CM

## 2019-01-21 DIAGNOSIS — Z3A24 24 weeks gestation of pregnancy: Secondary | ICD-10-CM

## 2019-01-21 DIAGNOSIS — O0990 Supervision of high risk pregnancy, unspecified, unspecified trimester: Secondary | ICD-10-CM

## 2019-01-21 NOTE — Progress Notes (Signed)
   PRENATAL VISIT NOTE  Subjective:  Theresa Johnston is a 28 y.o. G3P1011 at [redacted]w[redacted]d being seen today for ongoing prenatal care.  She is currently monitored for the following issues for this low-risk pregnancy and has Family history of Lynch syndrome; Hypermobile joints; History of poor fetal growth; Supervision of high risk pregnancy, antepartum; and GBS bacteriuria on their problem list.  Patient reports no complaints.  Contractions: Not present. Vag. Bleeding: None.  Movement: Present. Denies leaking of fluid.   The following portions of the patient's history were reviewed and updated as appropriate: allergies, current medications, past family history, past medical history, past social history, past surgical history and problem list.   Objective:   Vitals:   01/21/19 0948  BP: 114/76  Weight: 143 lb (64.9 kg)    Fetal Status:     Movement: Present     General:  Alert, oriented and cooperative. Patient is in no acute distress.  Skin: Skin is warm and dry. No rash noted.   Cardiovascular: Normal heart rate noted  Respiratory: Normal respiratory effort, no problems with respiration noted  Abdomen: Soft, gravid, appropriate for gestational age.  Pain/Pressure: Absent     Pelvic: Cervical exam deferred        Extremities: Normal range of motion.  Edema: None  Mental Status: Normal mood and affect. Normal behavior. Normal judgment and thought content.   Assessment and Plan:  Pregnancy: G3P1011 at [redacted]w[redacted]d 1. Supervision of high risk pregnancy, antepartum Continue routine prenatal care.   Preterm labor symptoms and general obstetric precautions including but not limited to vaginal bleeding, contractions, leaking of fluid and fetal movement were reviewed in detail with the patient. Please refer to After Visit Summary for other counseling recommendations.   Return in 4 weeks (on 02/18/2019). with 28 wk labs  Future Appointments  Date Time Provider [place]  01/29/2019 12:30 PM  Desenglau, Tommy Rainwater, PT [facility]  02/04/2019 12:30 PM Desenglau, Tommy Rainwater, PT [facility]  02/13/2019  2:30 PM Desenglau, Tommy Rainwater, PT [facility]  02/17/2019  2:30 PM Desenglau, Tommy Rainwater, PT [facility]  02/18/2019  8:15 AM Donnamae Jude, MD [facility]  02/24/2019  2:30 PM Desenglau, Tommy Rainwater, PT [facility]  03/10/2019  2:00 PM Desenglau, Tommy Rainwater, PT [facility]  03/17/2019 10:15 AM WH-MFC [place] 4 WH-MFCUS MFC-US    Donnamae Jude, MD

## 2019-01-21 NOTE — Patient Instructions (Signed)
Contraception Choices  Contraception, also called birth control, refers to methods or devices that prevent pregnancy.  Hormonal methods  Contraceptive implant    A contraceptive implant is a thin, plastic tube that contains a hormone. It is inserted into the upper part of the arm. It can remain in place for up to 3 years.  Progestin-only injections  Progestin-only injections are injections of progestin, a synthetic form of the hormone progesterone. They are given every 3 months by a health care provider.  Birth control pills    Birth control pills are pills that contain hormones that prevent pregnancy. They must be taken once a day, preferably at the same time each day.  Birth control patch    The birth control patch contains hormones that prevent pregnancy. It is placed on the skin and must be changed once a week for three weeks and removed on the fourth week. A prescription is needed to use this method of contraception.  Vaginal ring    A vaginal ring contains hormones that prevent pregnancy. It is placed in the vagina for three weeks and removed on the fourth week. After that, the process is repeated with a new ring. A prescription is needed to use this method of contraception.  Emergency contraceptive  Emergency contraceptives prevent pregnancy after unprotected sex. They come in pill form and can be taken up to 5 days after sex. They work best the sooner they are taken after having sex. Most emergency contraceptives are available without a prescription. This method should not be used as your only form of birth control.  Barrier methods  Female condom    A female condom is a thin sheath that is worn over the penis during sex. Condoms keep sperm from going inside a woman's body. They can be used with a spermicide to increase their effectiveness. They should be disposed after a single use.  Female condom    A female condom is a soft, loose-fitting sheath that is put into the vagina before sex. The condom keeps sperm  from going inside a woman's body. They should be disposed after a single use.  Diaphragm    A diaphragm is a soft, dome-shaped barrier. It is inserted into the vagina before sex, along with a spermicide. The diaphragm blocks sperm from entering the uterus, and the spermicide kills sperm. A diaphragm should be left in the vagina for 6-8 hours after sex and removed within 24 hours.  A diaphragm is prescribed and fitted by a health care provider. A diaphragm should be replaced every 1-2 years, after giving birth, after gaining more than 15 lb (6.8 kg), and after pelvic surgery.  Cervical cap    A cervical cap is a round, soft latex or plastic cup that fits over the cervix. It is inserted into the vagina before sex, along with spermicide. It blocks sperm from entering the uterus. The cap should be left in place for 6-8 hours after sex and removed within 48 hours. A cervical cap must be prescribed and fitted by a health care provider. It should be replaced every 2 years.  Sponge    A sponge is a soft, circular piece of polyurethane foam with spermicide on it. The sponge helps block sperm from entering the uterus, and the spermicide kills sperm. To use it, you make it wet and then insert it into the vagina. It should be inserted before sex, left in for at least 6 hours after sex, and removed and thrown away within   30 hours.  Spermicides  Spermicides are chemicals that kill or block sperm from entering the cervix and uterus. They can come as a cream, jelly, suppository, foam, or tablet. A spermicide should be inserted into the vagina with an applicator at least 10-15 minutes before sex to allow time for it to work. The process must be repeated every time you have sex. Spermicides do not require a prescription.  Intrauterine contraception  Intrauterine device (IUD)  An IUD is a T-shaped device that is put in a woman's uterus. There are two types:   Hormone IUD.This type contains progestin, a synthetic form of the hormone  progesterone. This type can stay in place for 3-5 years.   Copper IUD.This type is wrapped in copper wire. It can stay in place for 10 years.    Permanent methods of contraception  Female tubal ligation  In this method, a woman's fallopian tubes are sealed, tied, or blocked during surgery to prevent eggs from traveling to the uterus.  Hysteroscopic sterilization  In this method, a small, flexible insert is placed into each fallopian tube. The inserts cause scar tissue to form in the fallopian tubes and block them, so sperm cannot reach an egg. The procedure takes about 3 months to be effective. Another form of birth control must be used during those 3 months.  Female sterilization  This is a procedure to tie off the tubes that carry sperm (vasectomy). After the procedure, the man can still ejaculate fluid (semen).  Natural planning methods  Natural family planning  In this method, a couple does not have sex on days when the woman could become pregnant.  Calendar method  This means keeping track of the length of each menstrual cycle, identifying the days when pregnancy can happen, and not having sex on those days.  Ovulation method  In this method, a couple avoids sex during ovulation.  Symptothermal method  This method involves not having sex during ovulation. The woman typically checks for ovulation by watching changes in her temperature and in the consistency of cervical mucus.  Post-ovulation method  In this method, a couple waits to have sex until after ovulation.  Summary   Contraception, also called birth control, means methods or devices that prevent pregnancy.   Hormonal methods of contraception include implants, injections, pills, patches, vaginal rings, and emergency contraceptives.   Barrier methods of contraception can include female condoms, female condoms, diaphragms, cervical caps, sponges, and spermicides.   There are two types of IUDs (intrauterine devices). An IUD can be put in a woman's uterus to  prevent pregnancy for 3-5 years.   Permanent sterilization can be done through a procedure for males, females, or both.   Natural family planning methods involve not having sex on days when the woman could become pregnant.  This information is not intended to replace advice given to you by your health care provider. Make sure you discuss any questions you have with your health care provider.  Document Released: 07/24/2005 Document Revised: 07/26/2017 Document Reviewed: 08/26/2016  Elsevier Interactive Patient Education  2019 [employer] Inc[employer]

## 2019-01-21 NOTE — Progress Notes (Signed)
I connected with  Theresa Johnston on 01/21/19 at  9:30 AM EDT by telephone and verified that I am speaking with the correct person using two identifiers.   I discussed the limitations, risks, security and privacy concerns of performing an evaluation and management service by telephone and the availability of in person appointments. I also discussed with the patient that there may be a patient responsible charge related to this service. The patient expressed understanding and agreed to proceed.  Crosby Oyster, RN 01/21/2019  9:49 AM

## 2019-01-24 ENCOUNTER — Encounter

## 2019-01-28 ENCOUNTER — Encounter: Payer: Self-pay | Admitting: Physical Therapy

## 2019-01-28 ENCOUNTER — Other Ambulatory Visit: Payer: Self-pay

## 2019-01-28 ENCOUNTER — Ambulatory Visit: Payer: Medicaid Other | Admitting: Physical Therapy

## 2019-01-28 DIAGNOSIS — M6281 Muscle weakness (generalized): Secondary | ICD-10-CM | POA: Diagnosis not present

## 2019-01-28 DIAGNOSIS — R279 Unspecified lack of coordination: Secondary | ICD-10-CM

## 2019-01-28 NOTE — Therapy (Signed)
[facility] [address], [place], [place], [address] Phone: [phone]   Fax:  [phone]  Physical Therapy Treatment  Patient Details  Name: Theresa Johnston MRN: [id number] Date of Birth: [birth year] Referring Provider (PT): Aletha Halim, MD   Encounter Date: 01/28/2019  PT End of Session - 01/28/19 1432    Visit Number  4    Date for PT Re-Evaluation  03/19/19    Authorization Type  medicaid    PT Start Time  1432    PT Stop Time  1515    PT Time Calculation (min)  43 min    Activity Tolerance  Patient tolerated treatment well    Behavior During Therapy  [facility] for tasks assessed/performed       Past Medical History:  Diagnosis Date  . Medical history non-contributory   . SVT (supraventricular tachycardia) ([place]) 2017    Past Surgical History:  Procedure Laterality Date  . ABLATION     SVT  . CHOLECYSTECTOMY  2017    There were no vitals filed for this visit.  Subjective Assessment - 01/28/19 1433    Subjective  My back hurts the days after I do the exercises so I only do them every other.  No pain today.  I have been having pain that is about 4/10 left lower back in the SI joint on the left.    Currently in Pain?  No/denies                       [facility] Adult PT Treatment/Exercise - 01/28/19 0001      Neuro Re-ed    Neuro Re-ed Details   pelvic tilt and TrA activation with exercises      Lumbar Exercises: Stretches   Other Lumbar Stretch Exercise  upper trunk rotation - 10x       Lumbar Exercises: Aerobic   Nustep  L1 x 10 min seat 8 with moist heat - cues to engage core      Lumbar Exercises: Standing   Other Standing Lumbar Exercises  hip abduction, extension - 10x each side; LE ext with green band - 10x    Other Standing Lumbar Exercises  wall plank with reaches      Lumbar Exercises: Sidelying   Other Sidelying Lumbar Exercises  hip adduction - 10x each side with cues to  maintain pelvic tilt               PT Short Term Goals - 01/09/19 1610      PT SHORT TERM GOAL #1   Title  ind with initial HEP    Status  Achieved      PT SHORT TERM GOAL #2   Title  Pt will be able to correctly engage core and pelvic floor when lifting her daughter    Status  On-going        PT Long Term Goals - 01/14/19 1337      PT LONG TERM GOAL #1   Title  Pt will be ind with HEP for strengthening exercises for healthy pregnancy      PT LONG TERM GOAL #2   Title  Pt will be able to take walks with her family without increased back pain due to improved strength and pain management techniques    Baseline  8/10 at worst    Status  On-going      PT LONG TERM GOAL #3   Title  Pt will  report back pain of 4/10 at most even on days with increased activity    Baseline  8/10 on NPRS    Status  On-going      PT LONG TERM GOAL #4   Title  Pt will be able to lift her daughter without low back pain due to improved ability to engage core and pelvic floor    Baseline  has back pain up to 8/10 on NPRS    Status  On-going            Plan - 01/28/19 1519    Clinical Impression Statement  Pt did well with exercises.  She had less hip stability in standing as she fatigued with exercises and needed cues to maintain posture.  HEP was adjusted due to increased pain after doing the exercises at home.  Pt felt good with moist heat on the nustep.  She will continue to benefit from skilled PT to work on improved core and hip strength    PT Treatment/Interventions  ADLs/Self Care Home Management;Biofeedback;Therapeutic activities;Therapeutic exercise;Neuromuscular re-education;Patient/family education;Manual techniques;Passive range of motion;Taping;Electrical Stimulation;Moist Heat;Cryotherapy    PT Next Visit Plan  f/u on changes to HEP, core and pelvic floor strength, adductor and hip strength    PT Home Exercise Plan  Access Code: [email or web address]    Recommended Other Services  cert  signed    Consulted and Agree with Plan of Care  Patient       Patient will benefit from skilled therapeutic intervention in order to improve the following deficits and impairments:  Postural dysfunction, Difficulty walking, Decreased coordination, Pain, Decreased strength  Visit Diagnosis: 1. Muscle weakness (generalized)   2. Unspecified lack of coordination        Problem List Patient Active Problem List   Diagnosis Date Noted  . GBS bacteriuria 12/09/2018  . Supervision of high risk pregnancy, antepartum 12/05/2018  . Family history of Lynch syndrome 12/02/2018  . 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 History of poor fetal growth 12/02/2018    Junious SilkJakki L Texie Tupou, PT 01/28/2019, 3:22 PM  [facility] [address], [address] [place], [place], [address] Phone: [phone]   Fax:  [phone]  Name: Renford DillsSavannah Curington MRN: [id number] Date of Birth: 09/01/1990

## 2019-01-29 ENCOUNTER — Ambulatory Visit: Payer: Medicaid Other | Admitting: Physical Therapy

## 2019-02-03 ENCOUNTER — Encounter: Payer: Self-pay | Admitting: Physical Therapy

## 2019-02-03 ENCOUNTER — Ambulatory Visit: Payer: Medicaid Other | Admitting: Physical Therapy

## 2019-02-03 ENCOUNTER — Other Ambulatory Visit: Payer: Self-pay

## 2019-02-03 DIAGNOSIS — M6281 Muscle weakness (generalized): Secondary | ICD-10-CM | POA: Diagnosis not present

## 2019-02-03 DIAGNOSIS — R279 Unspecified lack of coordination: Secondary | ICD-10-CM

## 2019-02-03 NOTE — Therapy (Signed)
[facility] [address], [address] [place], [place], [address] Phone: [phone]   Fax:  [phone]  Physical Therapy Treatment  Patient Details  Name: Renford DillsSavannah Bloxham MRN: [id number] Date of Birth: [birth year] Referring Provider (PT): Bowles BingPickens, Charlie, MD   Encounter Date: 02/03/2019  PT End of Session - 02/03/19 1539    Visit Number  5    Date for PT Re-Evaluation  03/19/19    Authorization Type  medicaid    PT Start Time  1532    PT Stop Time  1617    PT Time Calculation (min)  45 min    Activity Tolerance  Patient tolerated treatment well    Behavior During Therapy  [facility] for tasks assessed/performed       Past Medical History:  Diagnosis Date  . Medical history non-contributory   . SVT (supraventricular tachycardia) ([facility]) 2017    Past Surgical History:  Procedure Laterality Date  . ABLATION     SVT  . CHOLECYSTECTOMY  2017    There were no vitals filed for this visit.  Subjective Assessment - 02/03/19 1535    Subjective  I felt the best I did after the last time I left    Currently in Pain?  Yes    Pain Score  4    it was 6 or 7 the last few days   Pain Orientation  Right    Pain Descriptors / Indicators  Sore    Pain Type  Chronic pain    Pain Onset  More than a month ago    Pain Frequency  Intermittent    Aggravating Factors   bending over    Pain Relieving Factors  massage    Multiple Pain Sites  No                       [facility] Adult PT Treatment/Exercise - 02/03/19 0001      Lumbar Exercises: Aerobic   Nustep  L1 x 10 min seat 8 with moist heat - cues to engage core      Lumbar Exercises: Standing   Wall Slides  10 reps;5 seconds    Wall Slides Limitations  red band around knees and cues for foot placement and pelvic tilt    Other Standing Lumbar Exercises  hip adduction red band - 10x each side - cues to contract Rt glutes    Other Standing Lumbar Exercises  pelvic tilt  against wall - 20x      Lumbar Exercises: Sidelying   Other Sidelying Lumbar Exercises  hip adduction - 10x each side with cues to maintain pelvic tilt      Manual Therapy   Soft tissue mobilization  bilat glutes, bilat lumbar paraspinals, bilat QL                PT Short Term Goals - 01/09/19 1610      PT SHORT TERM GOAL #1   Title  ind with initial HEP    Status  Achieved      PT SHORT TERM GOAL #2   Title  Pt will be able to correctly engage core and pelvic floor when lifting her daughter    Status  On-going        PT Long Term Goals - 02/03/19 1632      PT LONG TERM GOAL #1   Title  Pt will be ind with HEP for strengthening exercises for healthy pregnancy  Status  On-going      PT LONG TERM GOAL #2   Title  Pt will be able to take walks with her family without increased back pain due to improved strength and pain management techniques    Baseline  able to go for a walk with family today and no increased pain, was at 4/10 pain today    Status  On-going      PT LONG TERM GOAL #3   Title  Pt will report back pain of 4/10 at most even on days with increased activity    Baseline  8/10 on NPRS at worst    Status  On-going      PT LONG TERM GOAL #4   Title  Pt will be able to lift her daughter without low back pain due to improved ability to engage core and pelvic floor    Baseline  has back pain up to 8/10 on NPRS    Status  On-going            Plan - 02/03/19 1628    Clinical Impression Statement  Pt had good result from HEP this week with no increased pain and was able to do every day.  She had some pain that occurred from bending down but it eased up.  Pt felt like her back was sore but better after session today.  She continues to need cues to maintain posterior pelvic tilt in standing.  Pt will benefit from skilled PT to work on strengthening core for improved function during her pregnancy.    PT Treatment/Interventions  ADLs/Self Care Home  Management;Biofeedback;Therapeutic activities;Therapeutic exercise;Neuromuscular re-education;Patient/family education;Manual techniques;Passive range of motion;Taping;Electrical Stimulation;Moist Heat;Cryotherapy    PT Next Visit Plan  core and pelvic floor strength, adductor and hip strength, emphasize pelvic tilt    PT Home Exercise Plan  Access Code: [email or web address]    Consulted and Agree with Plan of Care  Patient       Patient will benefit from skilled therapeutic intervention in order to improve the following deficits and impairments:  Postural dysfunction, Difficulty walking, Decreased coordination, Pain, Decreased strength  Visit Diagnosis: 1. Muscle weakness (generalized)   2. Unspecified lack of coordination        Problem List Patient Active Problem List   Diagnosis Date Noted  . GBS bacteriuria 12/09/2018  . Supervision of high risk pregnancy, antepartum 12/05/2018  . Family history of Lynch syndrome 12/02/2018  . 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 History of poor fetal growth 12/02/2018    Jule Ser, PT 02/03/2019, 4:33 PM  [facility] [address], [place], [place], [address] Phone: [phone]   Fax:  [phone]  Name: Selda Jalbert MRN: [id number] Date of Birth: [birth year]

## 2019-02-04 ENCOUNTER — Encounter: Payer: Medicaid Other | Admitting: Physical Therapy

## 2019-02-06 ENCOUNTER — Encounter

## 2019-02-13 ENCOUNTER — Other Ambulatory Visit: Payer: Self-pay

## 2019-02-13 ENCOUNTER — Ambulatory Visit: Payer: Medicaid Other | Attending: Obstetrics and Gynecology | Admitting: Physical Therapy

## 2019-02-13 ENCOUNTER — Encounter: Payer: Self-pay | Admitting: Physical Therapy

## 2019-02-13 DIAGNOSIS — R279 Unspecified lack of coordination: Secondary | ICD-10-CM | POA: Diagnosis present

## 2019-02-13 DIAGNOSIS — M6281 Muscle weakness (generalized): Secondary | ICD-10-CM

## 2019-02-13 NOTE — Therapy (Signed)
[facility] [address] [place], [place], [address] Phone: [phone]   Fax:  [phone]  Physical Therapy Treatment  Patient Details  Name: Theresa Johnston MRN: [id number] Date of Birth: 24-Dec-1990 Referring Provider (PT): Aletha Halim, MD   Encounter Date: 02/13/2019  PT End of Session - 02/13/19 1511    Visit Number  6    Date for PT Re-Evaluation  03/19/19    Authorization Type  medicaid    Authorization - Visit Number  5    Authorization - Number of Visits  8    PT Start Time  1430    PT Stop Time  1509    PT Time Calculation (min)  39 min    Activity Tolerance  Patient tolerated treatment well    Behavior During Therapy  [facility] for tasks assessed/performed       Past Medical History:  Diagnosis Date  . Medical history non-contributory   . SVT (supraventricular tachycardia) ([place]) 2017    Past Surgical History:  Procedure Laterality Date  . ABLATION     SVT  . CHOLECYSTECTOMY  2017    There were no vitals filed for this visit.  Subjective Assessment - 02/13/19 1432    Subjective  I felt sore after the weekend because I was chasing kids around.  My husband got the knots out and it felt better.    Patient Stated Goals  be able to have a healthy pregnancy, reduced low back pain    Currently in Pain?  No/denies                       [facility] Adult PT Treatment/Exercise - 02/13/19 0001      Neuro Re-ed    Neuro Re-ed Details   pelvic tilt and TrA activation with exercises; sitting on green ball and doing UE exercises       Lumbar Exercises: Aerobic   Nustep  L2 x 10 min seat 8 with moist heat - cues to engage core      Lumbar Exercises: Standing   Wall Slides  10 reps;5 seconds   on red ball   Other Standing Lumbar Exercises  wall push up on red ball - 12 reps      Lumbar Exercises: Seated   Hip Flexion on Ball  Strengthening;Right;Left;20 reps;Limitations    Sit to Stand  15  reps;Limitations    Sit to Stand Limitations  cues to keep back straight and hinge at hips    Other Seated Lumbar Exercises  sittingon ball UE horizontal abduction and diagonals - red band - 15x each               PT Short Term Goals - 01/09/19 1610      PT SHORT TERM GOAL #1   Title  ind with initial HEP    Status  Achieved      PT SHORT TERM GOAL #2   Title  Pt will be able to correctly engage core and pelvic floor when lifting her daughter    Status  On-going        PT Long Term Goals - 02/03/19 1632      PT LONG TERM GOAL #1   Title  Pt will be ind with HEP for strengthening exercises for healthy pregnancy    Status  On-going      PT LONG TERM GOAL #2   Title  Pt will be able to take  walks with her family without increased back pain due to improved strength and pain management techniques    Baseline  able to go for a walk with family today and no increased pain, was at 4/10 pain today    Status  On-going      PT LONG TERM GOAL #3   Title  Pt will report back pain of 4/10 at most even on days with increased activity    Baseline  8/10 on NPRS at worst    Status  On-going      PT LONG TERM GOAL #4   Title  Pt will be able to lift her daughter without low back pain due to improved ability to engage core and pelvic floor    Baseline  has back pain up to 8/10 on NPRS    Status  On-going            Plan - 02/13/19 1512    Clinical Impression Statement  Pt had no pain this weekend other than when she had family over and had to run around with the kids.  Pt is doing much better with maintaining posterior pelvic tilt.  Pt needed a lot of cues during sit to stand to maintain posture.  Pt is making progress but will continue to benefit from skilled PT to work towards functional goals.    PT Treatment/Interventions  ADLs/Self Care Home Management;Biofeedback;Therapeutic activities;Therapeutic exercise;Neuromuscular re-education;Patient/family education;Manual  techniques;Passive range of motion;Taping;Electrical Stimulation;Moist Heat;Cryotherapy    PT Next Visit Plan  core and pelvic floor strength, adductor and hip strength, emphasize pelvic tilt    PT Home Exercise Plan  Access Code: [email or web address]    Consulted and Agree with Plan of Care  Patient       Patient will benefit from skilled therapeutic intervention in order to improve the following deficits and impairments:  Postural dysfunction, Difficulty walking, Decreased coordination, Pain, Decreased strength  Visit Diagnosis: 1. Muscle weakness (generalized)   2. Unspecified lack of coordination        Problem List Patient Active Problem List   Diagnosis Date Noted  . GBS bacteriuria 12/09/2018  . Supervision of high risk pregnancy, antepartum 12/05/2018  . Family history of Lynch syndrome 12/02/2018  . 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 History of poor fetal growth 12/02/2018    Junious SilkJakki L Philbert Ocallaghan, PT  02/13/2019, 3:19 PM  [facility] [address], [address] [place], [place], [address] Phone: [phone]   Fax:  [phone]  Name: Theresa DillsSavannah Bansal MRN: [id number] Date of Birth: [birth year]

## 2019-02-17 ENCOUNTER — Ambulatory Visit: Payer: Medicaid Other | Admitting: Physical Therapy

## 2019-02-17 ENCOUNTER — Other Ambulatory Visit: Payer: Self-pay

## 2019-02-17 ENCOUNTER — Encounter: Payer: Self-pay | Admitting: Physical Therapy

## 2019-02-17 DIAGNOSIS — R279 Unspecified lack of coordination: Secondary | ICD-10-CM

## 2019-02-17 DIAGNOSIS — M6281 Muscle weakness (generalized): Secondary | ICD-10-CM | POA: Diagnosis not present

## 2019-02-17 NOTE — Patient Instructions (Signed)
Access Code: [email or web address]  URL: [email or web address]  Date: 02/17/2019  Prepared by: Jari Favre   Exercises  Quadruped Alternating Arm Lift - 10 reps - 1 sets - 1x daily - 7x weekly  Sidelying Hip Abduction - 10 reps - 1 sets - 1x daily - 7x weekly  Seated Long Arc Quad with Hip Adduction - 10 reps - 1 sets - 1x daily - 7x weekly  Seated Upper Extremity Diagonals with Resistance on Swiss Ball - 10 reps - 1 sets - 1x daily - 7x weekly  Shoulder extension with resistance - Neutral - 10 reps - 1 sets - 1x daily - 7x weekly  Standing Plank on Wall with Reaches - 10 reps - 3 sets - 1x daily - 7x weekly  Wall Slide with Posterior Pelvic Tilt - 10 reps - 3 sets - 1x daily - 7x weekly  Seated Calf Stretch - 3 reps - 1 sets - 30 sec hold - 1x daily - 7x weekly  Gastroc Stretch on Wall - 3 reps - 1 sets - 30 sec hold - 1x daily - 7x weekly

## 2019-02-17 NOTE — Therapy (Signed)
[facility] [address], [place], [place], [address] Phone: [phone]   Fax:  [phone]  Physical Therapy Treatment  Patient Details  Name: Theresa Johnston MRN: [id number] Date of Birth: September 01, 1990 Referring Provider (PT): Aletha Halim, MD   Encounter Date: 02/17/2019  PT End of Session - 02/17/19 1438    Visit Number  7    Date for PT Re-Evaluation  03/19/19    Authorization Type  medicaid    Authorization - Visit Number  6    Authorization - Number of Visits  8    PT Start Time  3762    PT Stop Time  1511    PT Time Calculation (min)  42 min    Activity Tolerance  Patient tolerated treatment well    Behavior During Therapy  [facility] for tasks assessed/performed       Past Medical History:  Diagnosis Date  . Medical history non-contributory   . SVT (supraventricular tachycardia) ([place]) 2017    Past Surgical History:  Procedure Laterality Date  . ABLATION     SVT  . CHOLECYSTECTOMY  2017    There were no vitals filed for this visit.  Subjective Assessment - 02/17/19 1436    Subjective  I felt great until yesterday when I was doing some moving.  I am sore today.    Currently in Pain?  Yes    Pain Score  5     Pain Location  Back    Pain Orientation  Left    Pain Descriptors / Indicators  Sore    Pain Onset  More than a month ago    Aggravating Factors   lifting light weight    Multiple Pain Sites  No                       [facility] Adult PT Treatment/Exercise - 02/17/19 0001      Lumbar Exercises: Stretches   Active Hamstring Stretch  Right;Left;20 seconds;3 reps    Gastroc Stretch  Right;Left;3 reps;30 seconds    Other Lumbar Stretch Exercise  TFL/IT band stretch in semi-reclined with strap - 3x 30 sec      Lumbar Exercises: Aerobic   Nustep  L1 x 10 min seat 8 with moist heat - cues to engage core   PT present for status update     Manual Therapy   Soft tissue mobilization   bilateral glutes and lumbar paraspinals, IT band and lateral quads - stretch to sacrum - used addaday to all previously mentioned muscle groups - sidelying             PT Education - 02/17/19 1511    Education Details  Access Code: [email or web address]    Person(s) Educated  Patient    Methods  Explanation;Demonstration;Verbal cues;Handout    Comprehension  Verbalized understanding;Returned demonstration       PT Short Term Goals - 02/17/19 1439      PT SHORT TERM GOAL #2   Title  Pt will be able to correctly engage core and pelvic floor when lifting her daughter    Status  Achieved        PT Long Term Goals - 02/17/19 1441      PT LONG TERM GOAL #2   Title  Pt will be able to take walks with her family without increased back pain due to improved strength and pain management techniques    Baseline  pain only increased  to 4 or 5/10    Status  On-going      PT LONG TERM GOAL #3   Title  Pt will report back pain of 4/10 at most even on days with increased activity    Baseline  currently 5/10 down from 8/10    Status  On-going      PT LONG TERM GOAL #4   Title  Pt will be able to lift her daughter without low back pain due to improved ability to engage core and pelvic floor    Baseline  no pain with lifting daughter this week    Status  On-going            Plan - 02/17/19 1515    Clinical Impression Statement  Pt had more muscle soreness today.  Overall, she is improving with walking and lifting her daughter.  She responded well to [facility] techniques and stretches.  She was able to get a good stretch with the techniques given today and stretches were added to HEP as appropriate.  pt will benefit from skilled PT to address changes to HEP as she progresses through pregnancy.    PT Treatment/Interventions  ADLs/Self Care Home Management;Biofeedback;Therapeutic activities;Therapeutic exercise;Neuromuscular re-education;Patient/family education;Manual techniques;Passive range of  motion;Taping;Electrical Stimulation;Moist Heat;Cryotherapy    PT Next Visit Plan  core and pelvic floor strength, adductor and hip strength, emphasize pelvic tilt    PT Home Exercise Plan  Access Code: [email or web address]    Consulted and Agree with Plan of Care  Patient       Patient will benefit from skilled therapeutic intervention in order to improve the following deficits and impairments:  Postural dysfunction, Difficulty walking, Decreased coordination, Pain, Decreased strength  Visit Diagnosis: 1. Muscle weakness (generalized)   2. Unspecified lack of coordination        Problem List Patient Active Problem List   Diagnosis Date Noted  . GBS bacteriuria 12/09/2018  . Supervision of high risk pregnancy, antepartum 12/05/2018  . Family history of Lynch syndrome 12/02/2018  . 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 History of poor fetal growth 12/02/2018    Junious SilkJakki L Richel Millspaugh, PT 02/17/2019, 3:24 PM  [facility] [address], [address] [place], [place], [address] Phone: [phone]   Fax:  [phone]  Name: Theresa Johnston MRN: [id number] Date of Birth: 22-Jan-1991

## 2019-02-18 ENCOUNTER — Ambulatory Visit (INDEPENDENT_AMBULATORY_CARE_PROVIDER_SITE_OTHER): Payer: Medicaid Other | Admitting: Family Medicine

## 2019-02-18 VITALS — BP 118/76 | HR 78 | Wt 150.2 lb

## 2019-02-18 DIAGNOSIS — O99820 Streptococcus B carrier state complicating pregnancy: Secondary | ICD-10-CM

## 2019-02-18 DIAGNOSIS — Z23 Encounter for immunization: Secondary | ICD-10-CM | POA: Diagnosis not present

## 2019-02-18 DIAGNOSIS — R8271 Bacteriuria: Secondary | ICD-10-CM

## 2019-02-18 DIAGNOSIS — Z3402 Encounter for supervision of normal first pregnancy, second trimester: Secondary | ICD-10-CM

## 2019-02-18 DIAGNOSIS — O0990 Supervision of high risk pregnancy, unspecified, unspecified trimester: Secondary | ICD-10-CM

## 2019-02-18 DIAGNOSIS — Z3A28 28 weeks gestation of pregnancy: Secondary | ICD-10-CM

## 2019-02-18 NOTE — Patient Instructions (Signed)

## 2019-02-18 NOTE — Progress Notes (Signed)
   PRENATAL VISIT NOTE  Subjective:  Theresa Johnston is a 28 y.o. G3P1011 at [redacted]w[redacted]d being seen today for ongoing prenatal care.  She is currently monitored for the following issues for this low-risk pregnancy and has Family history of Lynch syndrome; Hypermobile joints; History of poor fetal growth; Supervision of high risk pregnancy, antepartum; and GBS bacteriuria on their problem list.  Patient reports no complaints.  Contractions: Not present.  .  Movement: Present. Denies leaking of fluid.   The following portions of the patient's history were reviewed and updated as appropriate: allergies, current medications, past family history, past medical history, past social history, past surgical history and problem list.   Objective:   Vitals:   02/18/19 0824  BP: 118/76  Pulse: 78  Weight: 150 lb 3.2 oz (68.1 kg)    Fetal Status: Fetal Heart Rate (bpm): 153 Fundal Height: 28 cm Movement: Present     General:  Alert, oriented and cooperative. Patient is in no acute distress.  Skin: Skin is warm and dry. No rash noted.   Cardiovascular: Normal heart rate noted  Respiratory: Normal respiratory effort, no problems with respiration noted  Abdomen: Soft, gravid, appropriate for gestational age.  Pain/Pressure: Absent     Pelvic: Cervical exam deferred        Extremities: Normal range of motion.  Edema: None  Mental Status: Normal mood and affect. Normal behavior. Normal judgment and thought content.   Assessment and Plan:  Pregnancy: G3P1011 at [redacted]w[redacted]d 1. Encounter for supervision of normal first pregnancy in third trimester 28 wk labs and TDaP today - Glucose Tolerance, 2 Hours w/1 Hour - RPR - CBC - HIV Antibody (routine testing w rflx)  2. GBS bacteriuria Needs treatment in labor  Preterm labor symptoms and general obstetric precautions including but not limited to vaginal bleeding, contractions, leaking of fluid and fetal movement were reviewed in detail with the patient. Please  refer to After Visit Summary for other counseling recommendations.   Return in 4 weeks (on 03/18/2019).  Future Appointments  Date Time Provider [place]  02/24/2019  2:30 PM Su Hoff, PT [facility]  03/10/2019  2:00 PM Desenglau, Tommy Rainwater, PT [facility]  03/17/2019 10:15 AM [facility] [place] 4 [facility]-M[facility]    Donnamae Jude, MD

## 2019-02-19 LAB — GLUCOSE TOLERANCE, 2 HOURS W/ 1HR
Glucose, 1 hour: 104 mg/dL (ref 65–179)
Glucose, 2 hour: 81 mg/dL (ref 65–152)
Glucose, Fasting: 76 mg/dL (ref 65–91)

## 2019-02-19 LAB — CBC
Hematocrit: 37.1 % (ref 34.0–46.6)
Hemoglobin: 13 g/dL (ref 11.1–15.9)
MCH: 31.3 pg (ref 26.6–33.0)
MCHC: 35 g/dL (ref 31.5–35.7)
MCV: 89 fL (ref 79–97)
Platelets: 203 10*3/uL (ref 150–450)
RBC: 4.15 x10E6/uL (ref 3.77–5.28)
RDW: 12.2 % (ref 11.7–15.4)
WBC: 8 10*3/uL (ref 3.4–10.8)

## 2019-02-19 LAB — HIV ANTIBODY (ROUTINE TESTING W REFLEX): HIV Screen 4th Generation wRfx: NONREACTIVE

## 2019-02-19 LAB — RPR: RPR Ser Ql: NONREACTIVE

## 2019-02-24 ENCOUNTER — Other Ambulatory Visit: Payer: Self-pay

## 2019-02-24 ENCOUNTER — Ambulatory Visit: Payer: Medicaid Other | Admitting: Physical Therapy

## 2019-02-24 ENCOUNTER — Encounter: Payer: Self-pay | Admitting: Physical Therapy

## 2019-02-24 DIAGNOSIS — M6281 Muscle weakness (generalized): Secondary | ICD-10-CM

## 2019-02-24 DIAGNOSIS — R279 Unspecified lack of coordination: Secondary | ICD-10-CM

## 2019-02-24 NOTE — Therapy (Signed)
[facility] [address], [address] [place], [place], [address] Phone: [phone]   Fax:  [phone]  Physical Therapy Treatment  Patient Details  Name: Theresa DillsSavannah Johnston MRN: [id number] Date of Birth: 04/02/1991 Referring Provider (PT): Turah BingPickens, Charlie, MD   Encounter Date: 02/24/2019  PT End of Session - 02/24/19 1431    Visit Number  8    Date for PT Re-Evaluation  03/19/19    Authorization Type  medicaid    Authorization - Visit Number  7    Authorization - Number of Visits  8    PT Start Time  1431    PT Stop Time  1510    PT Time Calculation (min)  39 min    Activity Tolerance  Patient tolerated treatment well    Behavior During Therapy  [facility] for tasks assessed/performed       Past Medical History:  Diagnosis Date  . Medical history non-contributory   . SVT (supraventricular tachycardia) ([facility]) 2017    Past Surgical History:  Procedure Laterality Date  . ABLATION     SVT  . CHOLECYSTECTOMY  2017    There were no vitals filed for this visit.  Subjective Assessment - 02/24/19 1517    Subjective  I have been feeling stronger overall.  I went on a 2 mile walk this weekend.  I had some cramping but I think I was just hot and dehydrated and it was after a mile and a half.    Patient Stated Goals  be able to have a healthy pregnancy, reduced low back pain    Currently in Pain?  No/denies                       [facility] Adult PT Treatment/Exercise - 02/24/19 0001      Lumbar Exercises: Aerobic   Nustep  L1 x 10 min seat 8 with moist heat - cues to engage core   PT present for status update; seat and UE #8     Lumbar Exercises: Standing   Other Standing Lumbar Exercises  sholder ext - red band - staggered stance and single leg stance - 10x each    Other Standing Lumbar Exercises  standing hip ext with glute squeeze - UE on mat table      Lumbar Exercises: Supine   Ab Set  3 seconds;15 reps    AB  Set Limitations  with ball squeeze    Heel Slides  10 reps    Heel Slides Limitations  10 reps bil; cues to engage core    Other Supine Lumbar Exercises  bent knee drop outs - 10x each side             PT Education - 02/24/19 1516    Education Details  added variations to shoulder ext and wall planks    Person(s) Educated  Patient    Methods  Explanation;Demonstration    Comprehension  Verbalized understanding;Returned demonstration       PT Short Term Goals - 02/17/19 1439      PT SHORT TERM GOAL #2   Title  Pt will be able to correctly engage core and pelvic floor when lifting her daughter    Status  Achieved        PT Long Term Goals - 02/17/19 1441      PT LONG TERM GOAL #2   Title  Pt will be able to take walks with her family without  increased back pain due to improved strength and pain management techniques    Baseline  pain only increased to 4 or 5/10    Status  On-going      PT LONG TERM GOAL #3   Title  Pt will report back pain of 4/10 at most even on days with increased activity    Baseline  currently 5/10 down from 8/10    Status  On-going      PT LONG TERM GOAL #4   Title  Pt will be able to lift her daughter without low back pain due to improved ability to engage core and pelvic floor    Baseline  no pain with lifting daughter this week    Status  On-going            Plan - 02/24/19 1504    Clinical Impression Statement  Pt was able to tolerate exercises well and exericse progressions.  Pt went for a long walk over the weekend and got a little sore towards the end but no long lasting soreness or cramps.  Pt is overall feeling stronger and having less pain.  She will continue to benefit from skilled PT to ensure healthy pregnancy and correctly strengthen hypermobile joints.    PT Treatment/Interventions  ADLs/Self Care Home Management;Biofeedback;Therapeutic activities;Therapeutic exercise;Neuromuscular re-education;Patient/family education;Manual  techniques;Passive range of motion;Taping;Electrical Stimulation;Moist Heat;Cryotherapy    PT Next Visit Plan  core and pelvic floor strength, adductor and hip strength, emphasize pelvic tilt    PT Home Exercise Plan  Access Code: [email or web address]    Consulted and Agree with Plan of Care  Patient       Patient will benefit from skilled therapeutic intervention in order to improve the following deficits and impairments:  Postural dysfunction, Difficulty walking, Decreased coordination, Pain, Decreased strength  Visit Diagnosis: 1. Muscle weakness (generalized)   2. Unspecified lack of coordination        Problem List Patient Active Problem List   Diagnosis Date Noted  . GBS bacteriuria 12/09/2018  . Supervision of high risk pregnancy, antepartum 12/05/2018  . Family history of Lynch syndrome 12/02/2018  . 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 History of poor fetal growth 12/02/2018    Jule Ser, PT 02/24/2019, 3:22 PM  [facility] [address] [place], [place], [address] Phone: [phone]   Fax:  [phone]  Name: Theresa Johnston MRN: [id number] Date of Birth: 03-17-1991

## 2019-03-10 ENCOUNTER — Encounter: Payer: Self-pay | Admitting: Physical Therapy

## 2019-03-10 ENCOUNTER — Other Ambulatory Visit: Payer: Self-pay

## 2019-03-10 ENCOUNTER — Ambulatory Visit: Payer: Medicaid Other | Attending: Obstetrics and Gynecology | Admitting: Physical Therapy

## 2019-03-10 DIAGNOSIS — R279 Unspecified lack of coordination: Secondary | ICD-10-CM

## 2019-03-10 DIAGNOSIS — M6281 Muscle weakness (generalized): Secondary | ICD-10-CM

## 2019-03-10 NOTE — Therapy (Signed)
[facility] [address], [place], [place], [address] Phone: [phone]   Fax:  [phone]  Physical Therapy Treatment  Patient Details  Name: Theresa Johnston MRN: [id number] Date of Birth: Dec 21, 1990 Referring Provider (PT): Aletha Halim, MD   Encounter Date: 03/10/2019  PT End of Session - 03/10/19 1401    Visit Number  9    Date for PT Re-Evaluation  05/05/19    Authorization Type  medicaid    Authorization - Visit Number  8    Authorization - Number of Visits  8    PT Start Time  3329    PT Stop Time  1443    PT Time Calculation (min)  41 min    Activity Tolerance  Patient tolerated treatment well       Past Medical History:  Diagnosis Date  . Medical history non-contributory   . SVT (supraventricular tachycardia) ([place]) 2017    Past Surgical History:  Procedure Laterality Date  . ABLATION     SVT  . CHOLECYSTECTOMY  2017    There were no vitals filed for this visit.  Subjective Assessment - 03/10/19 1404    Subjective  [place] and [place] getting contractions.    Patient Stated Goals  be able to have a healthy pregnancy, reduced low back pain    Currently in Pain?  Yes    Pain Score  3     Aggravating Factors   bending or in certain position                       [facility] Adult PT Treatment/Exercise - 03/10/19 0001      Lumbar Exercises: Stretches   Active Hamstring Stretch  Right;Left;20 seconds;3 reps    Gastroc Stretch  Right;Left;3 reps;30 seconds      Lumbar Exercises: Aerobic   Nustep  L1 x 10 min seat 8 with moist heat - cues to engage core   PT present for status update; seat and UE #8     Lumbar Exercises: Seated   Other Seated Lumbar Exercises  cues for posture and TrA engaged      Manual Therapy   Soft tissue mobilization  bilateral glutes and lumbar paraspinals, IT band and lateral quads - stretch to sacrum - used addaday to all  previously mentioned muscle groups - sidelying; addaday used                PT Short Term Goals - 02/17/19 1439      PT SHORT TERM GOAL #2   Title  Pt will be able to correctly engage core and pelvic floor when lifting her daughter    Status  Achieved        PT Long Term Goals - 03/10/19 1406      PT LONG TERM GOAL #1   Title  Pt will be ind with HEP for strengthening exercises for healthy pregnancy    Time  8    Period  Weeks    Status  On-going    Target Date  05/05/19      PT LONG TERM GOAL #2   Title  Pt will be able to take walks with her family without increased back pain due to improved strength and pain management techniques    Baseline  no pain with a normal daily walk, but [place] and was 8/10 for a couple days  Time  8    Period  Weeks    Status  On-going    Target Date  05/05/19      PT LONG TERM GOAL #3   Title  Pt will report back pain of 4/10 at most even on days with increased activity    Baseline  was zero to 1/10 but had 8/10 pain with walk [place] the [place]    Time  8    Period  Weeks    Status  On-going    Target Date  05/05/19      PT LONG TERM GOAL #4   Title  Pt will be able to lift her daughter without low back pain due to improved ability to engage core and pelvic floor    Baseline  no pain    Time  --    Period  --    Status  Achieved            Plan - 03/10/19 1418    Clinical Impression Statement  Pt has made good progress with all goals.  She is walking farther but then has pain and contraction type of cramps if she walks too far.  Pt is recommended to do shorter spurts of exercise.  Pt met goal for improved muscle coordination with lifting her daughter for improved stability and dercreased pain.  She did well with reviewed exercises and STM.  She will continue to benefit from skiled PT to ensure safe and healthy pregnancy.    Personal Factors and Comorbidities  Age;Comorbidity 2;Time since onset of  injury/illness/exacerbation    Examination-Activity Limitations  Lift;Stand    Stability/Clinical Decision Making  Evolving/Moderate complexity    Rehab Potential  Excellent    PT Treatment/Interventions  ADLs/Self Care Home Management;Biofeedback;Therapeutic activities;Therapeutic exercise;Neuromuscular re-education;Patient/family education;Manual techniques;Passive range of motion;Taping;Electrical Stimulation;Moist Heat;Cryotherapy    PT Next Visit Plan  core and pelvic floor strength, adductor and hip strength, emphasize pelvic tilt, STM    PT Home Exercise Plan  Access Code: [email or web address]    Consulted and Agree with Plan of Care  Patient       Patient will benefit from skilled therapeutic intervention in order to improve the following deficits and impairments:  Postural dysfunction, Difficulty walking, Decreased coordination, Pain, Decreased strength  Visit Diagnosis: 1. Muscle weakness (generalized)   2. Unspecified lack of coordination        Problem List Patient Active Problem List   Diagnosis Date Noted  . GBS bacteriuria 12/09/2018  . Supervision of high risk pregnancy, antepartum 12/05/2018  . Family history of Lynch syndrome 12/02/2018  . 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 History of poor fetal growth 12/02/2018    Jule Ser, PT 03/10/2019, 2:47 PM  [facility] [address], [place], [place], [address] Phone: [phone]   Fax:  [phone]  Name: Becca Bayne MRN: [id number] Date of Birth: Dec 17, 1990

## 2019-03-13 ENCOUNTER — Other Ambulatory Visit: Payer: Self-pay

## 2019-03-13 ENCOUNTER — Inpatient Hospital Stay (HOSPITAL_COMMUNITY)
Admission: AD | Admit: 2019-03-13 | Discharge: 2019-03-13 | Disposition: A | Discharge status: Home / Self Care | Payer: Medicaid Other | Attending: Obstetrics & Gynecology | Admitting: Obstetrics & Gynecology

## 2019-03-13 ENCOUNTER — Encounter (HOSPITAL_COMMUNITY): Payer: Self-pay | Admitting: *Deleted

## 2019-03-13 DIAGNOSIS — O479 False labor, unspecified: Secondary | ICD-10-CM

## 2019-03-13 DIAGNOSIS — O4703 False labor before 37 completed weeks of gestation, third trimester: Secondary | ICD-10-CM | POA: Insufficient documentation

## 2019-03-13 DIAGNOSIS — R1030 Lower abdominal pain, unspecified: Secondary | ICD-10-CM | POA: Diagnosis present

## 2019-03-13 DIAGNOSIS — Z3689 Encounter for other specified antenatal screening: Secondary | ICD-10-CM

## 2019-03-13 DIAGNOSIS — Z3A31 31 weeks gestation of pregnancy: Secondary | ICD-10-CM | POA: Diagnosis not present

## 2019-03-13 LAB — URINALYSIS, ROUTINE W REFLEX MICROSCOPIC
Bilirubin Urine: NEGATIVE
Glucose, UA: NEGATIVE mg/dL
Hgb urine dipstick: NEGATIVE
Ketones, ur: 5 mg/dL — AB
Nitrite: NEGATIVE
Protein, ur: NEGATIVE mg/dL
Specific Gravity, Urine: 1.011 (ref 1.005–1.030)
pH: 6 (ref 5.0–8.0)

## 2019-03-13 MED ORDER — NIFEDIPINE 10 MG PO CAPS
10.0000 mg | ORAL_CAPSULE | ORAL | Status: DC | PRN
  Administered 2019-03-13: 10 mg via ORAL
  Filled 2019-03-13: qty 1

## 2019-03-13 MED ORDER — ACETAMINOPHEN 500 MG PO TABS
1000.0000 mg | ORAL_TABLET | Freq: Once | ORAL | Status: AC
  Administered 2019-03-13: 1000 mg via ORAL
  Filled 2019-03-13: qty 2

## 2019-03-13 MED ORDER — LACTATED RINGERS IV BOLUS: 1000.0000 mL | Freq: Once | INTRAVENOUS | Status: DC

## 2019-03-13 NOTE — Discharge Instructions (Signed)

## 2019-03-13 NOTE — MAU Note (Signed)
PT SAYS ALL DAY HAS BEEN FEELING  CRAMPING  AND LOWER BACK  STARTED AT 630PM. . [place]  WITH  [facility]. LAST SEX- JULY

## 2019-03-13 NOTE — MAU Provider Note (Signed)
History     CSN: [id number]  Arrival date and time: 03/13/19 2006   First Provider Initiated Contact with Patient 03/13/19 2035      Chief Complaint  Patient presents with  . Abdominal Pain   HPI Theresa Johnston is a .28 y.o. G3P1011 at [redacted]w[redacted]d who presents to MAU with chief complaint of lower abdominal contractions, new onset today at 1830. She rates her pain as 3/10. She denies aggravating or alleviating factors. She has not taken medication or tried other treatments for this complaint. She denies vaginal bleeding, leaking of fluid, decreased fetal movement, fever, falls, or recent illness.    OB History    Gravida  3   Para  1   Term  1   Preterm      AB  1   Living  1     SAB  1   TAB      Ectopic      Multiple      Live Births  1           Past Medical History:  Diagnosis Date  . Medical history non-contributory   . SVT (supraventricular tachycardia) ([place]) 2017    Past Surgical History:  Procedure Laterality Date  . ABLATION     SVT  . CHOLECYSTECTOMY  2017    History reviewed. No pertinent family history.  Social History   Tobacco Use  . Smoking status: Never Smoker  . Smokeless tobacco: Never Used  Substance Use Topics  . Alcohol use: Not Currently  . Drug use: Not Currently    Allergies: No Known Allergies  Medications Prior to Admission  Medication Sig Dispense Refill Last Dose  . Prenatal Vit-Fe Fumarate-FA (MULTIVITAMIN-PRENATAL) 27-0.8 MG TABS tablet Take 1 tablet by mouth daily at 12 noon.   03/12/2019 at Unknown time    Review of Systems  Constitutional: Negative for chills, fatigue and fever.  Respiratory: Negative for shortness of breath.   Gastrointestinal: Positive for abdominal pain.  Genitourinary: Negative for difficulty urinating, dysuria, flank pain, vaginal bleeding, vaginal discharge and vaginal pain.  Musculoskeletal: Negative for back pain.  All other systems reviewed and are negative.  Physical Exam    Blood pressure 125/69, temperature 98.2 F (36.8 C), temperature source Oral, resp. rate 20, height 5\' 7"  (1.702 m), weight 70.4 kg, last menstrual period 08/06/2018.  Physical Exam  Nursing note and vitals reviewed. Constitutional: She is oriented to person, place, and time. She appears well-developed and well-nourished.  Cardiovascular: Normal rate.  Respiratory: No respiratory distress.  GI: Soft. She exhibits no distension. There is no abdominal tenderness. There is no rebound and no guarding.  Genitourinary:    No vaginal discharge.   Neurological: She is alert and oriented to person, place, and time.  Skin: Skin is warm and dry.  Psychiatric: She has a normal mood and affect. Her behavior is normal. Judgment and thought content normal.    MAU Course/MDM  Procedures  --Cervix closed --Reactive tracing: baseline 135, moderate variability, positive accels, no decels --[place]oco: occasional contractions, intermittent UI, unable to palpate --Patient denies pain at 2120 hours --Procardia x 1, additional doses withheld due to blood pressure  Meds ordered this encounter  Medications  . lactated ringers bolus 1,000 mL  . NIFEdipine (PROCARDIA) capsule 10 mg  . acetaminophen (TYLENOL) tablet 1,000 mg   Assessment and Plan  --28 y.o. G3P1011 at [redacted]w[redacted]d  --Braxton Hicks contractions --Reactive tracing --Closed cervix --Denies pain at time of discharge  F/U:  [facility] 03/18/2019   Calvert CantorSamantha C , CNM 03/13/2019, 11:40 PM

## 2019-03-17 ENCOUNTER — Other Ambulatory Visit: Payer: Self-pay

## 2019-03-17 ENCOUNTER — Ambulatory Visit (HOSPITAL_COMMUNITY)
Admission: RE | Admit: 2019-03-17 | Discharge: 2019-03-17 | Disposition: A | Discharge status: Home / Self Care | Payer: Medicaid Other | Source: Ambulatory Visit | Attending: Obstetrics and Gynecology | Admitting: Obstetrics and Gynecology

## 2019-03-17 ENCOUNTER — Ambulatory Visit: Payer: Medicaid Other | Admitting: Physical Therapy

## 2019-03-17 DIAGNOSIS — Z3A31 31 weeks gestation of pregnancy: Secondary | ICD-10-CM | POA: Diagnosis not present

## 2019-03-17 DIAGNOSIS — O09293 Supervision of pregnancy with other poor reproductive or obstetric history, third trimester: Secondary | ICD-10-CM

## 2019-03-17 DIAGNOSIS — Z362 Encounter for other antenatal screening follow-up: Secondary | ICD-10-CM

## 2019-03-17 DIAGNOSIS — O365930 Maternal care for other known or suspected poor fetal growth, third trimester, not applicable or unspecified: Secondary | ICD-10-CM

## 2019-03-17 DIAGNOSIS — Z8489 Family history of other specified conditions: Secondary | ICD-10-CM | POA: Diagnosis not present

## 2019-03-18 ENCOUNTER — Other Ambulatory Visit (HOSPITAL_COMMUNITY): Payer: Self-pay | Admitting: *Deleted

## 2019-03-18 ENCOUNTER — Telehealth: Payer: Medicaid Other | Admitting: Obstetrics and Gynecology

## 2019-03-18 DIAGNOSIS — Z364 Encounter for antenatal screening for fetal growth retardation: Secondary | ICD-10-CM

## 2019-03-20 ENCOUNTER — Telehealth (INDEPENDENT_AMBULATORY_CARE_PROVIDER_SITE_OTHER): Payer: Medicaid Other | Admitting: Obstetrics and Gynecology

## 2019-03-20 ENCOUNTER — Other Ambulatory Visit: Payer: Self-pay

## 2019-03-20 VITALS — BP 122/82

## 2019-03-20 DIAGNOSIS — Z87898 Personal history of other specified conditions: Secondary | ICD-10-CM

## 2019-03-20 DIAGNOSIS — O0993 Supervision of high risk pregnancy, unspecified, third trimester: Secondary | ICD-10-CM

## 2019-03-20 DIAGNOSIS — Z3A32 32 weeks gestation of pregnancy: Secondary | ICD-10-CM

## 2019-03-20 DIAGNOSIS — O0990 Supervision of high risk pregnancy, unspecified, unspecified trimester: Secondary | ICD-10-CM

## 2019-03-20 DIAGNOSIS — R8271 Bacteriuria: Secondary | ICD-10-CM

## 2019-03-20 NOTE — Progress Notes (Signed)
Did go to MAU for PTL last week , just some mild cramping, doing better   I connected with  Loletta Parish on 03/20/19 at  1:40 PM EDT by telephone and verified that I am speaking with the correct person using two identifiers.   I discussed the limitations, risks, security and privacy concerns of performing an evaluation and management service by telephone and the availability of in person appointments. I also discussed with the patient that there may be a patient responsible charge related to this service. The patient expressed understanding and agreed to proceed.  Crosby Oyster, RN 03/20/2019  2:03 PM

## 2019-03-20 NOTE — Progress Notes (Signed)
   TELEHEALTH OBSTETRICS PRENATAL VIRTUAL VIDEO VISIT ENCOUNTER NOTE  Provider location: Center for [place] at [facility]   I connected with Theresa Johnston on 03/20/19 at  1:40 PM EDT by MyChart Video Encounter at home and verified that I am speaking with the correct person using two identifiers.   I discussed the limitations, risks, security and privacy concerns of performing an evaluation and management service virtually and the availability of in person appointments. I also discussed with the patient that there may be a patient responsible charge related to this service. The patient expressed understanding and agreed to proceed. Subjective:  Theresa Johnston is a 28 y.o. G3P1011 at [redacted]w[redacted]d being seen today for ongoing prenatal care.  She is currently monitored for the following issues for this low-risk pregnancy and has Family history of Lynch syndrome; Hypermobile joints; History of poor fetal growth; Supervision of high risk pregnancy, antepartum; and GBS bacteriuria on their problem list.  Patient reports occasional contractions.  Contractions: Irritability. Vag. Bleeding: None.  Movement: Present. Denies any leaking of fluid.   The following portions of the patient's history were reviewed and updated as appropriate: allergies, current medications, past family history, past medical history, past social history, past surgical history and problem list.   Objective:   Vitals:   03/20/19 1401  BP: 122/82    Fetal Status:     Movement: Present     General:  Alert, oriented and cooperative. Patient is in no acute distress.  Respiratory: Normal respiratory effort, no problems with respiration noted  Mental Status: Normal mood and affect. Normal behavior. Normal judgment and thought content.  Rest of physical exam deferred due to type of encounter  Imaging:   Assessment and Plan:  Pregnancy: G3P1011 at [redacted]w[redacted]d  1. Supervision of high risk pregnancy, antepartum Doing well  Seen at [facility] for preterm contractions, they have improved since last week  2. GBS bacteriuria ppx in labor  3. History of poor fetal growth 32 weeks [place] with EFW at 17th%tile Has repeat at 36 weeks   Preterm labor symptoms and general obstetric precautions including but not limited to vaginal bleeding, contractions, leaking of fluid and fetal movement were reviewed in detail with the patient. I discussed the assessment and treatment plan with the patient. The patient was provided an opportunity to ask questions and all were answered. The patient agreed with the plan and demonstrated an understanding of the instructions. The patient was advised to call back or seek an in-person office evaluation/go to [facility] at [facility] for any urgent or concerning symptoms. Please refer to After Visit Summary for other counseling recommendations.   I provided 22 minutes of face-to-face time during this encounter.  Return in about 2 weeks (around 04/03/2019) for OB visit (MD), virtual.   Future Appointments  Date Time Provider [place]  03/25/2019  2:45 PM Desenglau, Tommy Rainwater, PT [facility]  03/31/2019  2:00 PM Desenglau, Tommy Rainwater, PT [facility]  04/07/2019  2:00 PM Desenglau, Tommy Rainwater, PT [facility]  04/15/2019 10:45 AM WH-MFC [place] 2 WH-MFCUS [facility]  04/15/2019  2:00 PM Desenglau, Tommy Rainwater, PT [facility]  04/21/2019  2:00 PM Desenglau, Tommy Rainwater, PT [facility]    Sloan Leiter, [place] for [facility], [place]

## 2019-03-24 NOTE — Telephone Encounter (Signed)
Called pt to follow on message, discussed labor precautions and increasing fluid and rest. Pt verbalizes.

## 2019-03-25 ENCOUNTER — Ambulatory Visit: Payer: Medicaid Other | Admitting: Physical Therapy

## 2019-03-25 ENCOUNTER — Other Ambulatory Visit: Payer: Self-pay

## 2019-03-25 DIAGNOSIS — R279 Unspecified lack of coordination: Secondary | ICD-10-CM

## 2019-03-25 DIAGNOSIS — M6281 Muscle weakness (generalized): Secondary | ICD-10-CM | POA: Diagnosis not present

## 2019-03-25 NOTE — Therapy (Signed)
[facility] [address], [address] [place], [place], [address] Phone: [phone]   Fax:  [phone]  Physical Therapy Treatment  Patient Details  Name: Theresa Johnston MRN: [id number] Date of Birth: [birth year] Referring Provider (PT): [place] BingPickens, Charlie, MD   Encounter Date: 03/25/2019  PT End of Session - 03/25/19 1509    Visit Number  10    Date for PT Re-Evaluation  05/05/19    Authorization Type  medicaid    Authorization - Visit Number  1    Authorization - Number of Visits  8    PT Start Time  1447    PT Stop Time  1526    PT Time Calculation (min)  39 min    Activity Tolerance  Patient tolerated treatment well    Behavior During Therapy  [facility] for tasks assessed/performed       Past Medical History:  Diagnosis Date  . Medical history non-contributory   . SVT (supraventricular tachycardia) (HCC) 2017    Past Surgical History:  Procedure Laterality Date  . ABLATION     SVT  . CHOLECYSTECTOMY  2017    There were no vitals filed for this visit.  Subjective Assessment - 03/25/19 1500    Subjective  Pt had some contractions last week.  She is very tired and sore and not able to do as much right now.    Patient Stated Goals  be able to have a healthy pregnancy, reduced low back pain    Currently in Pain?  No/denies                       [facility] Adult PT Treatment/Exercise - 03/25/19 0001      Neuro Re-ed    Neuro Re-ed Details   coordinating core and pelvic floor with all exercises listed      Lumbar Exercises: Aerobic   Nustep  L1 x 10 min seat 8 with moist heat - cues to engage core   PT present for status update; seat and UE #8     Lumbar Exercises: Sidelying   Clam  Right;Left;20 reps    Clam Limitations  reverse clam 20x each side    Other Sidelying Lumbar Exercises  core and pelvic floor engaged      Lumbar Exercises: Quadruped   Single Arm Raise  Right;Left;10 reps    Other  Quadruped Lumbar Exercises  pelvic and core stabilzation - 20x    Other Quadruped Lumbar Exercises  fwd/back rocking             PT Education - 03/25/19 1529    Education Details  Access Code: [email or web address]    Person(s) Educated  Patient    Methods  Explanation;Demonstration    Comprehension  Verbalized understanding;Returned demonstration       PT Short Term Goals - 02/17/19 1439      PT SHORT TERM GOAL #2   Title  Pt will be able to correctly engage core and pelvic floor when lifting her daughter    Status  Achieved        PT Long Term Goals - 03/10/19 1406      PT LONG TERM GOAL #1   Title  Pt will be ind with HEP for strengthening exercises for healthy pregnancy    Time  8    Period  Weeks    Status  On-going    Target Date  05/05/19      PT LONG  TERM GOAL #2   Title  Pt will be able to take walks with her family without increased back pain due to improved strength and pain management techniques    Baseline  no pain with a normal daily walk, but [place] and was 8/10 for a couple days    Time  8    Period  Weeks    Status  On-going    Target Date  05/05/19      PT LONG TERM GOAL #3   Title  Pt will report back pain of 4/10 at most even on days with increased activity    Baseline  was zero to 1/10 but had 8/10 pain [place] the [place]    Time  8    Period  Weeks    Status  On-going    Target Date  05/05/19      PT LONG TERM GOAL #4   Title  Pt will be able to lift her daughter without low back pain due to improved ability to engage core and pelvic floor    Baseline  no pain    Time  --    Period  --    Status  Achieved            Plan - 03/25/19 1511    Clinical Impression Statement  Today's session focused on gentle core strengthening in gravity limited postitions.  Pt is trying to avoid further complications.  She will benefit from continued skilled PT for core strengthening and adjustments to be made to HEP as needed as the  pregnancy advances.    PT Treatment/Interventions  ADLs/Self Care Home Management;Biofeedback;Therapeutic activities;Therapeutic exercise;Neuromuscular re-education;Patient/family education;Manual techniques;Passive range of motion;Taping;Electrical Stimulation;Moist Heat;Cryotherapy    PT Home Exercise Plan  Access Code: [email or web address]    Consulted and Agree with Plan of Care  Patient       Patient will benefit from skilled therapeutic intervention in order to improve the following deficits and impairments:  Postural dysfunction, Difficulty walking, Decreased coordination, Pain, Decreased strength  Visit Diagnosis: 1. Muscle weakness (generalized)   2. Unspecified lack of coordination        Problem List Patient Active Problem List   Diagnosis Date Noted  . GBS bacteriuria 12/09/2018  . Supervision of high risk pregnancy, antepartum 12/05/2018  . Family history of Lynch syndrome 12/02/2018  . 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 History of poor fetal growth 12/02/2018    Jule Ser, PT 03/25/2019, 3:32 PM  [facility] [address], [place], [place], [address] Phone: [phone]   Fax:  [phone]  Name: Theresa Johnston MRN: [id number] Date of Birth: October 26, 1990

## 2019-03-25 NOTE — Patient Instructions (Signed)
Access Code: [email or web address]  URL: [email or web address]  Date: 03/25/2019  Prepared by: Jari Favre   Exercises  Seated Long Arc Quad with Hip Adduction - 10 reps - 1 sets - 1x daily - 7x weekly  Seated Upper Extremity Diagonals with Resistance on Swiss Ball - 10 reps - 1 sets - 1x daily - 7x weekly  Shoulder extension with resistance - Neutral - 10 reps - 1 sets - 1x daily - 7x weekly  Standing Plank on Wall with Reaches - 10 reps - 3 sets - 1x daily - 7x weekly  Wall Slide with Posterior Pelvic Tilt - 10 reps - 3 sets - 1x daily - 7x weekly  Seated Calf Stretch - 3 reps - 1 sets - 30 sec hold - 1x daily - 7x weekly  Gastroc Stretch on Wall - 3 reps - 1 sets - 30 sec hold - 1x daily - 7x weekly  Quadruped Alternating Arm Lift - 10 reps - 1 sets - 1x daily - 7x weekly  Sidelying Reverse Clamshell - 10 reps - 3 sets - 1x daily - 7x weekly  Clamshell - 10 reps - 3 sets - 1x daily - 7x weekly  Quadruped Abdominal and Pelvic Brace - 10 reps - 3 sets - 1x daily - 7x weekly  Quadruped Pelvic Floor Contraction with Weight Shift Forward Backward - 10 reps - 3 sets - 1x daily - 7x weekly

## 2019-03-26 ENCOUNTER — Telehealth: Payer: Self-pay | Admitting: *Deleted

## 2019-03-26 NOTE — Telephone Encounter (Signed)
Returned patients call requesting nurse to call her. Pt still having some cramping and states she has lost more of her mucus plug. Informed to increase water intake and to continue to monitor and we will change her next week visit to in office in case we need to do cervical check and to let us know if things worse prior to and we will see her sooner. Pt verbalizes and understands.

## 2019-03-31 ENCOUNTER — Ambulatory Visit: Payer: Medicaid Other | Admitting: Physical Therapy

## 2019-03-31 ENCOUNTER — Other Ambulatory Visit: Payer: Self-pay

## 2019-03-31 DIAGNOSIS — M6281 Muscle weakness (generalized): Secondary | ICD-10-CM

## 2019-03-31 DIAGNOSIS — R279 Unspecified lack of coordination: Secondary | ICD-10-CM

## 2019-03-31 NOTE — Therapy (Signed)
[facility] [address], [place], [place], [address] Phone: [phone]   Fax:  [phone]  Physical Therapy Treatment  Patient Details  Name: Theresa Johnston MRN: [id number] Date of Birth: [birth year] Referring Provider (PT): Aletha Halim, MD   Encounter Date: 03/31/2019  PT End of Session - 03/31/19 1404    Visit Number  11    Date for PT Re-Evaluation  05/05/19    Authorization Type  medicaid    Authorization - Visit Number  2    Authorization - Number of Visits  8    PT Start Time  6283    PT Stop Time  1439    PT Time Calculation (min)  41 min    Activity Tolerance  Patient tolerated treatment well    Behavior During Therapy  [facility] for tasks assessed/performed       Past Medical History:  Diagnosis Date  . Medical history non-contributory   . SVT (supraventricular tachycardia) ([place]) 2017    Past Surgical History:  Procedure Laterality Date  . ABLATION     SVT  . CHOLECYSTECTOMY  2017    There were no vitals filed for this visit.  Subjective Assessment - 03/31/19 1419    Subjective  I feel like my pelvis broke in half it has been very pain the last couple of days.    Patient Stated Goals  be able to have a healthy pregnancy, reduced low back pain    Currently in Pain?  Yes    Pain Score  6     Pain Location  Pelvis    Pain Orientation  Mid;Lateral    Pain Descriptors / Indicators  Sharp;Stabbing    Pain Type  Acute pain    Pain Onset  More than a month ago    Multiple Pain Sites  No                       [facility] Adult PT Treatment/Exercise - 03/31/19 0001      Self-Care   Other Self-Care Comments   educated and performed pencil skirt technique      Lumbar Exercises: Aerobic   Nustep  L1 x 14 min seat 8 with moist heat - cues to engage core   PT present for status update; seat and UE #8     Lumbar Exercises: Quadruped   Other Quadruped Lumbar Exercises  modified leaning  forward on the mat table - 90 deg engaging core and pelvic floor      Manual Therapy   Manual Therapy  Muscle Energy Technique    Soft tissue mobilization  Rt QL and hip    Muscle Energy Technique  Lt LE ext; Rt LE flex , abduction and adduction - isometric contraction to reset pelvis               PT Short Term Goals - 02/17/19 1439      PT SHORT TERM GOAL #2   Title  Pt will be able to correctly engage core and pelvic floor when lifting her daughter    Status  Achieved        PT Long Term Goals - 03/10/19 1406      PT LONG TERM GOAL #1   Title  Pt will be ind with HEP for strengthening exercises for healthy pregnancy    Time  8    Period  Weeks    Status  On-going  Target Date  05/05/19      PT LONG TERM GOAL #2   Title  Pt will be able to take walks with her family without increased back pain due to improved strength and pain management techniques    Baseline  no pain with a normal daily walk, but [place] and was 8/10 for a couple days    Time  8    Period  Weeks    Status  On-going    Target Date  05/05/19      PT LONG TERM GOAL #3   Title  Pt will report back pain of 4/10 at most even on days with increased activity    Baseline  was zero to 1/10 but had 8/10 pain [place]    Time  8    Period  Weeks    Status  On-going    Target Date  05/05/19      PT LONG TERM GOAL #4   Title  Pt will be able to lift her daughter without low back pain due to improved ability to engage core and pelvic floor    Baseline  no pain    Time  --    Period  --    Status  Achieved            Plan - 03/31/19 1406    Clinical Impression Statement  Pt is in more pain today and unable to do a lot of different positions.  she benefitted from today's session focusing on releasing low back tension and gentle abdominal contractions for greater pelvic stability.    PT Treatment/Interventions  ADLs/Self Care Home  Management;Biofeedback;Therapeutic activities;Therapeutic exercise;Neuromuscular re-education;Patient/family education;Manual techniques;Passive range of motion;Taping;Electrical Stimulation;Moist Heat;Cryotherapy    PT Next Visit Plan  core and pelvic floor strength, adductor and hip strength, emphasize pelvic tilt, STM    PT Home Exercise Plan  Access Code: [email or web address]    Consulted and Agree with Plan of Care  Patient       Patient will benefit from skilled therapeutic intervention in order to improve the following deficits and impairments:  Postural dysfunction, Difficulty walking, Decreased coordination, Pain, Decreased strength  Visit Diagnosis: Muscle weakness (generalized)  Unspecified lack of coordination     Problem List Patient Active Problem List   Diagnosis Date Noted  . GBS bacteriuria 12/09/2018  . Supervision of high risk pregnancy, antepartum 12/05/2018  . Family history of Lynch syndrome 12/02/2018  . 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 History of poor fetal growth 12/02/2018    Junious SilkJakki L Angella Montas, PT 03/31/2019, 2:43 PM  [facility] [address], [address] [place], [place], [address] Phone: [phone]   Fax:  [phone]  Name: Theresa Johnston MRN: [id number] Date of Birth: 26-Jul-1991

## 2019-04-03 ENCOUNTER — Ambulatory Visit (INDEPENDENT_AMBULATORY_CARE_PROVIDER_SITE_OTHER): Payer: Medicaid Other | Admitting: Family Medicine

## 2019-04-03 ENCOUNTER — Other Ambulatory Visit: Payer: Self-pay

## 2019-04-03 ENCOUNTER — Other Ambulatory Visit (HOSPITAL_COMMUNITY)
Admission: RE | Admit: 2019-04-03 | Discharge: 2019-04-03 | Disposition: A | Discharge status: Home / Self Care | Payer: Medicaid Other | Source: Ambulatory Visit | Attending: Family Medicine | Admitting: Family Medicine

## 2019-04-03 VITALS — BP 127/76 | HR 96 | Wt 160.6 lb

## 2019-04-03 DIAGNOSIS — O0993 Supervision of high risk pregnancy, unspecified, third trimester: Secondary | ICD-10-CM

## 2019-04-03 DIAGNOSIS — Z87898 Personal history of other specified conditions: Secondary | ICD-10-CM

## 2019-04-03 DIAGNOSIS — O0990 Supervision of high risk pregnancy, unspecified, unspecified trimester: Secondary | ICD-10-CM

## 2019-04-03 DIAGNOSIS — Z800 Family history of malignant neoplasm of digestive organs: Secondary | ICD-10-CM

## 2019-04-03 DIAGNOSIS — Z3A34 34 weeks gestation of pregnancy: Secondary | ICD-10-CM

## 2019-04-03 NOTE — Progress Notes (Signed)
   PRENATAL VISIT NOTE  Subjective:  Theresa Johnston is a 28 y.o. G3P1011 at [redacted]w[redacted]d being seen today for ongoing prenatal care.  She is currently monitored for the following issues for this low-risk pregnancy and has Family history of Lynch syndrome; Hypermobile joints; History of poor fetal growth; Supervision of high risk pregnancy, antepartum; and GBS bacteriuria on their problem list.  Patient reports cramping with occasional ctx.  Contractions: Irritability. Vag. Bleeding: None.  Movement: Present. Denies leaking of fluid.   The following portions of the patient's history were reviewed and updated as appropriate: allergies, current medications, past family history, past medical history, past social history, past surgical history and problem list.   Objective:   Vitals:   04/03/19 1016  BP: 127/76  Pulse: 96  Weight: 160 lb 9.6 oz (72.8 kg)    Fetal Status: Fetal Heart Rate (bpm): 140   Movement: Present     General:  Alert, oriented and cooperative. Patient is in no acute distress.  Skin: Skin is warm and dry. No rash noted.   Cardiovascular: Normal heart rate noted  Respiratory: Normal respiratory effort, no problems with respiration noted  Abdomen: Soft, gravid, appropriate for gestational age.  Pain/Pressure: Present     Pelvic: Cervical exam performed        Extremities: Normal range of motion.  Edema: None  Mental Status: Normal mood and affect. Normal behavior. Normal judgment and thought content.   Assessment and Plan:  Pregnancy: G3P1011 at 102w2d 1. Family history of Lynch syndrome  2. Supervision of high risk pregnancy, antepartum Minimal/no cervical change. Likely cramping/ctx from [facility] No need for GBS given she will be treated in labor due to GBS bactiuria Reviewed [place] and discussed follow up scans - Chlamydia/GC NAA, Confirmation  Preterm labor symptoms and general obstetric precautions including but not limited to vaginal bleeding, contractions, leaking of fluid  and fetal movement were reviewed in detail with the patient. Please refer to After Visit Summary for other counseling recommendations.   Return in about 2 weeks (around 04/17/2019) for Telehealth/Virtual health OB Visit.  Future Appointments  Date Time Provider [place]  04/07/2019  2:00 PM Su Hoff, [place] [facility]  04/15/2019 10:45 AM WH-MFC [place] 2 WH-MFCUS M[facility]-US  04/15/2019  2:00 PM Desenglau, Tommy Rainwater, PT [facility]  04/21/2019  2:00 PM Desenglau, Tommy Rainwater, PT [facility]    Caren Macadam, MD

## 2019-04-03 NOTE — Progress Notes (Signed)
Pt presents for ROB. Pt states that she has been having some cramping. No other concerns.

## 2019-04-03 NOTE — Patient Instructions (Signed)
Product/process development scientist [facility] [employer] in [place], [place] Address: [address], [place], [place] [address] Hours:  Closed ? Opens 2PM Phone: [phone]

## 2019-04-07 ENCOUNTER — Encounter: Payer: Self-pay | Admitting: Physical Therapy

## 2019-04-07 ENCOUNTER — Other Ambulatory Visit: Payer: Self-pay

## 2019-04-07 ENCOUNTER — Ambulatory Visit: Payer: Medicaid Other | Admitting: Physical Therapy

## 2019-04-07 DIAGNOSIS — M6281 Muscle weakness (generalized): Secondary | ICD-10-CM

## 2019-04-07 DIAGNOSIS — R279 Unspecified lack of coordination: Secondary | ICD-10-CM

## 2019-04-07 LAB — CERVICOVAGINAL ANCILLARY ONLY
Bacterial vaginitis: NEGATIVE
Candida vaginitis: NEGATIVE
Chlamydia: NEGATIVE
Neisseria Gonorrhea: NEGATIVE
Trichomonas: NEGATIVE

## 2019-04-07 NOTE — Therapy (Signed)
[facility] [address], [address] [place], [place], [address] Phone: [phone]   Fax:  [phone]  Physical Therapy Treatment  Patient Details  Name: Theresa Johnston MRN: [id number] Date of Birth: 03-Aug-1991 Referring Provider (PT): [place] BingPickens, Charlie, MD   Encounter Date: 04/07/2019  PT End of Session - 04/07/19 1404    Visit Number  12    Date for PT Re-Evaluation  05/05/19    Authorization Type  medicaid    Authorization - Visit Number  3    Authorization - Number of Visits  8    PT Start Time  1401    PT Stop Time  1442    PT Time Calculation (min)  41 min    Activity Tolerance  Patient tolerated treatment well    Behavior During Therapy  [facility] tasks assessed/performed       Past Medical History:  Diagnosis Date  . Medical history non-contributory   . SVT (supraventricular tachycardia) (HCC) 2017    Past Surgical History:  Procedure Laterality Date  . ABLATION     SVT  . CHOLECYSTECTOMY  2017    There were no vitals filed for this visit.  Subjective Assessment - 04/07/19 1405    Subjective  I went on a long walk yesterday by accident.  My pain was at 4-5/10 but today is 8/10    Patient Stated Goals  be able to have a healthy pregnancy, reduced low back pain    Currently in Pain?  Yes    Pain Score  8     Pain Location  Pelvis    Pain Orientation  Mid;Left;Lower    Pain Descriptors / Indicators  Stabbing    Pain Type  Acute pain    Pain Radiating Towards  across the low belly and into the left leg    Pain Onset  More than a month ago    Pain Frequency  Intermittent    Multiple Pain Sites  No                       [facility] Adult PT Treatment/Exercise - 04/07/19 0001      Lumbar Exercises: Aerobic   Nustep  L1 x 10 min seat 8 with moist heat - cues to engage core   PT present for status update; seat and UE #8     Manual Therapy   Soft tissue mobilization  bilat QL and hip, glutes,  lumbar and thoracic paraspinals               PT Short Term Goals - 02/17/19 1439      PT SHORT TERM GOAL #2   Title  Pt will be able to correctly engage core and pelvic floor when lifting her daughter    Status  Achieved        PT Long Term Goals - 03/10/19 1406      PT LONG TERM GOAL #1   Title  Pt will be ind with HEP for strengthening exercises for healthy pregnancy    Time  8    Period  Weeks    Status  On-going    Target Date  05/05/19      PT LONG TERM GOAL #2   Title  Pt will be able to take walks with her family without increased back pain due to improved strength and pain management techniques    Baseline  no pain with a normal daily walk,  but [place] and was 8/10 for a couple days    Time  8    Period  Weeks    Status  On-going    Target Date  05/05/19      PT LONG TERM GOAL #3   Title  Pt will report back pain of 4/10 at most even on days with increased activity    Baseline  was zero to 1/10 but had 8/10 pain with walk [place] the [place]    Time  8    Period  Weeks    Status  On-going    Target Date  05/05/19      PT LONG TERM GOAL #4   Title  Pt will be able to lift her daughter without low back pain due to improved ability to engage core and pelvic floor    Baseline  no pain    Time  --    Period  --    Status  Achieved            Plan - 04/07/19 1440    Clinical Impression Statement  Pt  was in more pain today due to taking a long walk yesterday.  Pt had a lot of trigger points throughout her thoracic and lumbar spine as well as glutes.  Pt tolerated treatment well with reduced pain after treatment.    PT Treatment/Interventions  ADLs/Self Care Home Management;Biofeedback;Therapeutic activities;Therapeutic exercise;Neuromuscular re-education;Patient/family education;Manual techniques;Passive range of motion;Taping;Electrical Stimulation;Moist Heat;Cryotherapy    PT Home Exercise Plan  Access Code: [email or web address]       Patient  will benefit from skilled therapeutic intervention in order to improve the following deficits and impairments:  Postural dysfunction, Difficulty walking, Decreased coordination, Pain, Decreased strength  Visit Diagnosis: Muscle weakness (generalized)  Unspecified lack of coordination     Problem List Patient Active Problem List   Diagnosis Date Noted  . GBS bacteriuria 12/09/2018  . Supervision of high risk pregnancy, antepartum 12/05/2018  . Family history of Lynch syndrome 12/02/2018  . 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 History of poor fetal growth 12/02/2018    Jule Ser, PT 04/07/2019, 3:10 PM  [facility] [address], [place], [place], [address] Phone: [phone]   Fax:  [phone]  Name: Theresa Johnston MRN: [id number] Date of Birth: 09-16-1990

## 2019-04-12 ENCOUNTER — Encounter (HOSPITAL_COMMUNITY): Payer: Self-pay

## 2019-04-12 ENCOUNTER — Inpatient Hospital Stay (HOSPITAL_COMMUNITY)
Admission: AD | Admit: 2019-04-12 | Discharge: 2019-04-12 | Disposition: A | Discharge status: Home / Self Care | Payer: Medicaid Other | Attending: Family Medicine | Admitting: Family Medicine

## 2019-04-12 ENCOUNTER — Other Ambulatory Visit: Payer: Self-pay

## 2019-04-12 ENCOUNTER — Inpatient Hospital Stay (HOSPITAL_BASED_OUTPATIENT_CLINIC_OR_DEPARTMENT_OTHER): Payer: Medicaid Other

## 2019-04-12 DIAGNOSIS — O4703 False labor before 37 completed weeks of gestation, third trimester: Secondary | ICD-10-CM | POA: Diagnosis not present

## 2019-04-12 DIAGNOSIS — O368130 Decreased fetal movements, third trimester, not applicable or unspecified: Secondary | ICD-10-CM | POA: Diagnosis present

## 2019-04-12 DIAGNOSIS — Z3A35 35 weeks gestation of pregnancy: Secondary | ICD-10-CM | POA: Diagnosis not present

## 2019-04-12 DIAGNOSIS — Z3689 Encounter for other specified antenatal screening: Secondary | ICD-10-CM | POA: Diagnosis not present

## 2019-04-12 HISTORY — DX: Anemia, unspecified: D64.9

## 2019-04-12 LAB — URINALYSIS, ROUTINE W REFLEX MICROSCOPIC
Bilirubin Urine: NEGATIVE
Glucose, UA: NEGATIVE mg/dL
Hgb urine dipstick: NEGATIVE
Ketones, ur: NEGATIVE mg/dL
Leukocytes,Ua: NEGATIVE
Nitrite: NEGATIVE
Protein, ur: NEGATIVE mg/dL
Specific Gravity, Urine: 1.004 — ABNORMAL LOW (ref 1.005–1.030)
pH: 7 (ref 5.0–8.0)

## 2019-04-12 NOTE — MAU Provider Note (Addendum)
History   [id number]   Chief Complaint  Patient presents with  . Decreased Fetal Movement    HPI Theresa DillsSavannah Johnston is a 28 y.o. female  213P1011 here with report of decreased fetal movement since last nigth. States felt no movement last night. Had normal fetal movement count earlier today but states the movement were "really small & faint". Didn't notice movement later in the day. Since arrival to MAU felt baby move once. Reports some cramping earlier today. No regular ctx or abdominal pain. Denies vaginal bleeding, LOF, or abdominal trauma.   Patient's last menstrual period was 08/06/2018 (exact date).  OB History  Gravida Para Term Preterm AB Living  3 1 1   1 1   SAB TAB Ectopic Multiple Live Births  1       1    # Outcome Date GA Lbr Len/2nd Weight Sex Delivery Anes PTL Lv  3 Current           2 Term 01/25/17 3051w0d  2665 g F Vag-Spont   LIV     Complications: IUGR (intrauterine growth restriction) affecting care of mother  1 SAB 12/2015            Past Medical History:  Diagnosis Date  . Anemia   . Medical history non-contributory   . SVT (supraventricular tachycardia) ([facility]) 2017    History reviewed. No pertinent family history.  Social History   Socioeconomic History  . Marital status: Married    Spouse name: Not on file  . Number of children: Not on file  . Years of education: Not on file  . Highest education level: Not on file  Occupational History  . Not on file  Social Needs  . Financial resource strain: Not on file  . Food insecurity    Worry: Not on file    Inability: Not on file  . Transportation needs    Medical: Not on file    Non-medical: Not on file  Tobacco Use  . Smoking status: Never Smoker  . Smokeless tobacco: Former Engineer, waterUser  Substance and Sexual Activity  . Alcohol use: Not Currently  . Drug use: Not Currently  . Sexual activity: Yes    Birth control/protection: None  Lifestyle  . Physical activity    Days per week: Not on file   Minutes per session: Not on file  . Stress: Not on file  Relationships  . Social Musicianconnections    Talks on phone: Not on file    Gets together: Not on file    Attends religious service: Not on file    Active member of club or organization: Not on file    Attends meetings of clubs or organizations: Not on file    Relationship status: Not on file  Other Topics Concern  . Not on file  Social History Narrative  . Not on file    No Known Allergies  No current facility-administered medications on file prior to encounter.    Current Outpatient Medications on File Prior to Encounter  Medication Sig Dispense Refill  . Prenatal Vit-Fe Fumarate-FA (MULTIVITAMIN-PRENATAL) 27-0.8 MG TABS tablet Take 1 tablet by mouth daily at 12 noon.       Review of Systems  Constitutional: Negative.   Gastrointestinal: Negative.   Genitourinary: Negative.      Physical Exam   Vitals:   04/12/19 1724 04/12/19 1728 04/12/19 1752  BP:  120/89 117/79  Pulse:  (!) 101 85  Resp:  18   Temp:  98.4 F (36.9 C)   TempSrc:  Oral   SpO2:  99% 97%  Weight: 73.3 kg    Height: 5\' 7"  (1.702 m)      Physical Exam  Nursing note and vitals reviewed. Constitutional: She appears well-developed and well-nourished. No distress.  Respiratory: Effort normal. No respiratory distress.  Skin: She is not diaphoretic.  Psychiatric: She has a normal mood and affect. Her behavior is normal. Judgment and thought content normal.   NST:  Baseline: 135 bpm, Variability: Good {> 6 bpm), Accelerations: Reactive and Decelerations: Absent  MAU Course  Procedures  MDM Reactive NST. Pt reports no movement since being put on the monitor. BPP ordered.   Pt in ultrasound. Care turned over to Dr. Rubye Beach, Junie Panning, NP 04/12/2019 6:10 PM   Care turned over to [facility], Hailey L, DO 04/12/2019 9:07 PM   MDM: Pt reports painful contractions have started while in MAU and are 4-5 minutes apart.   Cervix examined and 1/50/-1, similar to recent exam in office.  Plan to recheck in 1-2 hours.   Cervix unchanged and contractions improved after PO hydration per pt.  D/C home with labor precautions and to monitor fetal movements, return to MAU as needed.  Keep scheduled appt at [facility].   A: 1. NST (non-stress test) reactive   2. [redacted] weeks gestation of pregnancy   3. Decreased fetal movement, third trimester, not applicable or unspecified fetus   4. Threatened preterm labor, third trimester     P: DC home with fetal kick counts and labor precautions  Fatima Blank, CNM 11:47 PM

## 2019-04-12 NOTE — Discharge Instructions (Signed)
Reasons to return to MAU at [facility]:  1.  Contractions are  5 minutes apart or less, each last 1 minute, these have been going on for 1-2 hours, and you cannot walk or talk during them 2.  You have a large gush of fluid, or a trickle of fluid that will not stop and you have to wear a pad 3.  You have bleeding that is bright red, heavier than spotting--like menstrual bleeding (spotting can be normal in early labor or after a check of your cervix) 4.  You do not feel the baby moving like he/she normally does  

## 2019-04-12 NOTE — MAU Note (Signed)
Theresa Johnston is a 28 y.o. at [redacted]w[redacted]d here in MAU reporting: decreased fetal movement since last night. Has felt some movement but less then normal. Also having some cramping. States she feels the cramping a few times per hour. No bleeding or LOF.  Onset of complaint: last night  Pain score: 3/10  Vitals:   04/12/19 1728  BP: 120/89  Pulse: (!) 101  Resp: 18  Temp: 98.4 F (36.9 C)  SpO2: 99%     FHT: 129  Lab orders placed from triage: UA

## 2019-04-15 ENCOUNTER — Ambulatory Visit: Payer: Medicaid Other | Admitting: Physical Therapy

## 2019-04-15 ENCOUNTER — Ambulatory Visit (HOSPITAL_COMMUNITY)
Admission: RE | Admit: 2019-04-15 | Discharge: 2019-04-15 | Disposition: A | Discharge status: Home / Self Care | Payer: Medicaid Other | Source: Ambulatory Visit | Attending: Obstetrics and Gynecology | Admitting: Obstetrics and Gynecology

## 2019-04-15 ENCOUNTER — Other Ambulatory Visit: Payer: Self-pay

## 2019-04-15 DIAGNOSIS — Z3A36 36 weeks gestation of pregnancy: Secondary | ICD-10-CM

## 2019-04-15 DIAGNOSIS — O09293 Supervision of pregnancy with other poor reproductive or obstetric history, third trimester: Secondary | ICD-10-CM

## 2019-04-15 DIAGNOSIS — Z362 Encounter for other antenatal screening follow-up: Secondary | ICD-10-CM | POA: Diagnosis not present

## 2019-04-15 DIAGNOSIS — Z364 Encounter for antenatal screening for fetal growth retardation: Secondary | ICD-10-CM | POA: Diagnosis present

## 2019-04-15 DIAGNOSIS — Z8489 Family history of other specified conditions: Secondary | ICD-10-CM | POA: Diagnosis not present

## 2019-04-21 ENCOUNTER — Encounter: Payer: Self-pay | Admitting: Obstetrics & Gynecology

## 2019-04-21 ENCOUNTER — Ambulatory Visit: Payer: Medicaid Other | Attending: Obstetrics and Gynecology | Admitting: Physical Therapy

## 2019-04-21 ENCOUNTER — Telehealth (INDEPENDENT_AMBULATORY_CARE_PROVIDER_SITE_OTHER): Payer: Medicaid Other | Admitting: Obstetrics & Gynecology

## 2019-04-21 ENCOUNTER — Other Ambulatory Visit: Payer: Self-pay

## 2019-04-21 DIAGNOSIS — R8271 Bacteriuria: Secondary | ICD-10-CM

## 2019-04-21 DIAGNOSIS — Z87898 Personal history of other specified conditions: Secondary | ICD-10-CM

## 2019-04-21 DIAGNOSIS — Z800 Family history of malignant neoplasm of digestive organs: Secondary | ICD-10-CM

## 2019-04-21 DIAGNOSIS — O0990 Supervision of high risk pregnancy, unspecified, unspecified trimester: Secondary | ICD-10-CM

## 2019-04-21 DIAGNOSIS — M6281 Muscle weakness (generalized): Secondary | ICD-10-CM | POA: Diagnosis present

## 2019-04-21 DIAGNOSIS — R279 Unspecified lack of coordination: Secondary | ICD-10-CM | POA: Diagnosis present

## 2019-04-21 DIAGNOSIS — O0993 Supervision of high risk pregnancy, unspecified, third trimester: Secondary | ICD-10-CM

## 2019-04-21 DIAGNOSIS — Z3A36 36 weeks gestation of pregnancy: Secondary | ICD-10-CM

## 2019-04-21 NOTE — Therapy (Addendum)
[facility] [address] [place], [place], [address] Phone: [phone]   Fax:  [phone]  Physical Therapy Treatment  Patient Details  Name: Theresa Johnston MRN: [id number] Date of Birth: October 15, 1990 Referring Provider (PT): Aletha Halim, MD   Encounter Date: 04/21/2019  PT End of Session - 04/21/19 1408    Visit Number  13    Date for PT Re-Evaluation  05/05/19    Authorization Type  medicaid    Authorization - Visit Number  4    Authorization - Number of Visits  8    PT Start Time  1400    PT Stop Time  1445    PT Time Calculation (min)  45 min    Activity Tolerance  Patient tolerated treatment well    Behavior During Therapy  [facility] for tasks assessed/performed       Past Medical History:  Diagnosis Date  . Anemia   . Medical history non-contributory   . SVT (supraventricular tachycardia) ([place]) 2017    Past Surgical History:  Procedure Laterality Date  . ABLATION     SVT  . CHOLECYSTECTOMY  2017    There were no vitals filed for this visit.  Subjective Assessment - 04/21/19 1403    Subjective  I started having contractions this weekend but they went away.  Everything hurts now.    Pain Score  7     Pain Location  Pelvis    Pain Orientation  Other (Comment)   all over   Pain Descriptors / Indicators  Aching;Sharp    Pain Type  Acute pain    Aggravating Factors   everything    Pain Relieving Factors  nothing    Effect of Pain on Daily Activities  moving is sharp, but sitting still is aching    Multiple Pain Sites  No                       [facility] Adult PT Treatment/Exercise - 04/21/19 0001      Lumbar Exercises: Aerobic   Nustep  L1 x 10 min seat 8 with moist heat - cues to engage core   PT present for status update; seat and UE #7     Lumbar Exercises: Sidelying   Other Sidelying Lumbar Exercises  UE adduction with yellow band - core engaged - 15x each side    Other  Sidelying Lumbar Exercises  ab sets in sidelying - core and pelvic floor engaged - 3 sec hold x 20 on each side      Manual Therapy   Soft tissue mobilization  bilat QL and hip, glutes, lumbar and thoracic paraspinals               PT Short Term Goals - 02/17/19 1439      PT SHORT TERM GOAL #2   Title  Pt will be able to correctly engage core and pelvic floor when lifting her daughter    Status  Achieved        PT Long Term Goals - 04/21/19 1453      PT LONG TERM GOAL #1   Title  Pt will be ind with HEP for strengthening exercises for healthy pregnancy    Baseline  is now 37 weeks    Status  On-going            Plan - 04/21/19 1410    Clinical Impression Statement  Pt is in more pain  today due to advanced term.  Pt did well with exercises in sidelying and was educated on doing abdominal and pelvic floor contracting on her side.  Pt will benefit from skilled PT to continue to work on strength for healthy pregnancy and pain management.    PT Treatment/Interventions  ADLs/Self Care Home Management;Biofeedback;Therapeutic activities;Therapeutic exercise;Neuromuscular re-education;Patient/family education;Manual techniques;Passive range of motion;Taping;Electrical Stimulation;Moist Heat;Cryotherapy    PT Next Visit Plan  core and pelvic floor strength as tolerated, STM to lumbar paraspinals and glutes    PT Home Exercise Plan  Access Code: [email or web address]    Consulted and Agree with Plan of Care  Patient       Patient will benefit from skilled therapeutic intervention in order to improve the following deficits and impairments:  Postural dysfunction, Difficulty walking, Decreased coordination, Pain, Decreased strength  Visit Diagnosis: Muscle weakness (generalized)  Unspecified lack of coordination     Problem List Patient Active Problem List   Diagnosis Date Noted  . GBS bacteriuria 12/09/2018  . Supervision of high risk pregnancy, antepartum 12/05/2018  . Family  history of Lynch syndrome 12/02/2018  . 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 History of poor fetal growth 12/02/2018    Jule Ser , PT 04/21/2019, 2:54 PM  [facility] [address], [place], [place], [address] Phone: [phone]   Fax:  [phone]  Name: Theresa Johnston MRN: [id number] Date of Birth: 04/17/1991  PHYSICAL THERAPY DISCHARGE SUMMARY  Visits from Start of Care: 13  Current functional level related to goals / functional outcomes: See above goals   Remaining deficits: See above   Education / Equipment: HEP Plan: Patient agrees to discharge.  Patient goals were partially met. Patient is being discharged due to a change in medical status.  ?????    [employer], PT 05/13/19 10:11 AM

## 2019-04-21 NOTE — Progress Notes (Signed)
Patient will take blood pressure today doing call

## 2019-04-21 NOTE — Progress Notes (Signed)
TELEHEALTH OBSTETRICS PRENATAL VIRTUAL VIDEO VISIT ENCOUNTER NOTE  Provider location: Center for [facility]   I connected with Renford Dills on 04/21/19 at  4:00 PM EDT by MyChart Video Encounter at home and verified that I am speaking with the correct person using two identifiers.   I discussed the limitations, risks, security and privacy concerns of performing an evaluation and management service virtually and the availability of in person appointments. I also discussed with the patient that there may be a patient responsible charge related to this service. The patient expressed understanding and agreed to proceed. Subjective:  Hadja Harral is a 28 y.o. G3P1011 at [redacted]w[redacted]d being seen today for ongoing prenatal care.  She is currently monitored for the following issues for this low-risk pregnancy and has Family history of Lynch syndrome; Hypermobile joints; History of poor fetal growth; Supervision of high risk pregnancy, antepartum; and GBS bacteriuria on their problem list.  Patient reports pain in pelvix at symphysis pubis. No bleeding or LOF. + FM  Denies any leaking of fluid.   The following portions of the patient's history were reviewed and updated as appropriate: allergies, current medications, past family history, past medical history, past social history, past surgical history and problem list.   Objective:  BP 128//86 P 85.  Fetal Status:           General:  Alert, oriented and cooperative. Patient is in no acute distress.  Respiratory: Normal respiratory effort, no problems with respiration noted  Mental Status: Normal mood and affect. Normal behavior. Normal judgment and thought content.  Rest of physical exam deferred due to type of encounter  Imaging: [place] Mfm Fetal Bpp Wo Non Stress  Result Date: 04/13/2019 ----------------------------------------------------------------------  OBSTETRICS REPORT                        (Signed Final 04/13/2019  09:44 am) ---------------------------------------------------------------------- Patient Info  ID #:       [id number]                          D.O.B.:  1991-05-22 (28 yrs)  Name:       [facility]                 Visit Date: 04/12/2019 08:02 pm ---------------------------------------------------------------------- Performed By  Performed By:     Earley Brooke     Ref. Address:      [address]                    [place], [place]                                                              Road  Attending:        Lin Landsman      Secondary Phy.:    MAU Nursing-                    MD  MAU/Triage  Referred By:      Jorje Guild          [facility]                    [place] ---------------------------------------------------------------------- Orders   #  Description                          Code         Ordered By   1  [place] MFM FETAL BPP WO NON              76819.01     ERIN LAWRENCE      STRESS  ----------------------------------------------------------------------   #  Order #                    Accession #                 Episode #   1  [id number]                  [id number]                  [id number]  ---------------------------------------------------------------------- Indications   Decreased fetal movements, third trimester,    O36.8130   unspecified   [redacted] weeks gestation of pregnancy                Z3A.35  ---------------------------------------------------------------------- Vital Signs                                                 Height:        5'7" ---------------------------------------------------------------------- Fetal Evaluation  Num Of Fetuses:          1  Fetal Heart Rate(bpm):   129  Cardiac Activity:        Observed  Presentation:            Cephalic  Amniotic Fluid  AFI FV:      Within normal limits  AFI Sum(cm)     %Tile       Largest Pocket(cm)  12.64            41          5.41  RUQ(cm)       RLQ(cm)       LUQ(cm)        LLQ(cm)  5.41          2.19          3.87           1.17 ---------------------------------------------------------------------- Biophysical Evaluation  Amniotic F.V:   Pocket => 2 cm             F. Tone:         Observed  F. Movement:    Observed                   Score:           8/8  F. Breathing:   Observed ---------------------------------------------------------------------- OB  History  Gravidity:    3         Term:   1        Prem:   0        SAB:   1  TOP:          0       Ectopic:  0        Living: 1 ---------------------------------------------------------------------- Gestational Age  LMP:           35w 4d        Date:  08/06/18                 EDD:   05/13/19  Best:          Consuello Closs 4d     Det. By:  LMP  (08/06/18)          EDD:   05/13/19 ---------------------------------------------------------------------- Impression  Biophysical profile 8/8  MAU noted for decreaesed fetal movement  good movement and fluid noted. ---------------------------------------------------------------------- Recommendations  Follow up as clinically indicated. ----------------------------------------------------------------------               Lin Landsman, MD Electronically Signed Final Report   04/13/2019 09:44 am ----------------------------------------------------------------------  [place] Mfm Ob Follow Up  Result Date: 04/15/2019 ----------------------------------------------------------------------  OBSTETRICS REPORT                       (Signed Final 04/15/2019 11:27 am) ---------------------------------------------------------------------- Patient Info  ID #:       [id number]                          D.O.B.:  1991/01/11 (28 yrs)  Name:       [facility]                 Visit Date: 04/15/2019 11:10 am ---------------------------------------------------------------------- Performed By  Performed By:     Marcellina Millin          Ref. Address:     [address]                    [place]                                                             [address]  Attending:        Noralee Space MD        [facility]  Referred By:      [facility] Mila Merry ---------------------------------------------------------------------- Orders   #  Description                          Code         Ordered By   1  [place] MFM OB FOLLOW UP  [id number]     RAVI SHANKAR  ----------------------------------------------------------------------   #  Order #                    Accession #                 Episode #   1  [id number]                  [id number]                  [id number]  ---------------------------------------------------------------------- Indications   Poor obstetric history: Previous fetal growth  O09.299   restriction (FGR)   [redacted] weeks gestation of pregnancy                Z3A.36   Family history of genetic disorder (lynch      Z84.89   syndrome and hypermobility)  ---------------------------------------------------------------------- Vital Signs                                                 Height:        5'7" ---------------------------------------------------------------------- Fetal Evaluation  Num Of Fetuses:         1  Fetal Heart Rate(bpm):  140  Cardiac Activity:       Observed  Presentation:           Cephalic  Placenta:               Anterior  P. Cord Insertion:      Previously Visualized  Amniotic Fluid  AFI FV:      Within normal limits  AFI Sum(cm)     %Tile       Largest Pocket(cm)  11.14           30          4.42  RUQ(cm)       RLQ(cm)       LUQ(cm)        LLQ(cm)  4.42          2.46          2.53           1.73 ---------------------------------------------------------------------- Biometry  BPD:      83.4  mm     G. Age:  33w 4d          5  %    CI:         72.6   %    70 - 86                                                          FL/HC:      21.1   %    20.1 -  22.1  HC:      311.3  mm     G. Age:  34w 6d          5  %    HC/AC:      0.98        0.93 - 1.11  AC:       318   mm     G. Age:  35w 5d  52  %    FL/BPD:     78.8   %    71 - 87  FL:       65.7  mm     G. Age:  33w 6d          5  %    FL/AC:      20.7   %    20 - 24  Est. FW:    2545  gm    5 lb 10 oz      23  % ---------------------------------------------------------------------- OB History  Gravidity:    3         Term:   1        Prem:   0        SAB:   1  TOP:          0       Ectopic:  0        Living: 1 ---------------------------------------------------------------------- Gestational Age  LMP:           36w 0d        Date:  08/06/18                 EDD:   05/13/19  U/S Today:     34w 4d                                        EDD:   05/23/19  Best:          36w 0d     Det. By:  LMP  (08/06/18)          EDD:   05/13/19 ---------------------------------------------------------------------- Anatomy  Cranium:               Appears normal         LVOT:                   Appears normal  Cavum:                 Appears normal         Aortic Arch:            Previously seen  Ventricles:            Appears normal         Ductal Arch:            Previously seen  Choroid Plexus:        Previously seen        Diaphragm:              Appears normal  Cerebellum:            Previously seen        Stomach:                Appears normal, left                                                                        sided  Posterior Fossa:       Previously seen  Abdomen:                Appears normal  Nuchal Fold:           Previously seen        Abdominal Wall:         Previously seen  Face:                  Orbits and profile     Cord Vessels:           Previously seen                         previously seen  Lips:                  Previously seen        Kidneys:                Appear normal  Palate:                Previously seen        Bladder:                Appears normal  Thoracic:              Appears normal          Spine:                  Previously seen  Heart:                 Appears normal         Upper Extremities:      Previously seen                         (4CH, axis, and                         situs)  RVOT:                  Appears normal         Lower Extremities:      Previously seen  Other:  Parents do not wish to know sex of fetus. 3VV previously visualized.          Technically difficult due to fetal position. Heels and 5th digit          previously visualized. ---------------------------------------------------------------------- Cervix Uterus Adnexa  Cervix  Not visualized (advanced GA >24wks) ---------------------------------------------------------------------- Impression  History of fetal growth restriction.  Amniotic fluid is normal and good fetal activity is seen. Fetal  growth is appropriate for gestational age.  We reassured the patient of the findings. ---------------------------------------------------------------------- Recommendations  Follow-up scans as clinically indicated. ----------------------------------------------------------------------                  Noralee Space, MD Electronically Signed Final Report   04/15/2019 11:27 am ----------------------------------------------------------------------   Assessment and Plan:  Pregnancy: G3P1011 at [redacted]w[redacted]d 1. Supervision of high risk pregnancy, antepartum + FM, BP WNL 2. History of poor fetal growth 04/15/2019 Est. FW:    2545  gm    5 lb 10 oz      23  %  3. GBS bacteriuria Needs atbx in labor  4. Family history of Lynch syndrome  Preterm labor symptoms and general obstetric precautions including but not limited to vaginal bleeding, contractions, leaking of fluid and fetal movement  were reviewed in detail with the patient. I discussed the assessment and treatment plan with the patient. The patient was provided an opportunity to ask questions and all were answered. The patient agreed with the plan and demonstrated an  understanding of the instructions. The patient was advised to call back or seek an in-person office evaluation/go to MAU at [facility] for any urgent or concerning symptoms. Please refer to After Visit Summary for other counseling recommendations.   I provided 12 minutes of face-to-face time during this encounter.  No follow-ups on file.  Future Appointments  Date Time Provider Department Center  04/21/2019  4:00 PM Willodean Rosenthal, MD [facility]    Willodean Rosenthal, MD [facility] for [facility], [facility]

## 2019-04-29 ENCOUNTER — Inpatient Hospital Stay (HOSPITAL_COMMUNITY)
Admission: AD | Admit: 2019-04-29 | Discharge: 2019-04-30 | Disposition: A | Discharge status: Home / Self Care | Payer: Medicaid Other | Attending: Family Medicine | Admitting: Family Medicine

## 2019-04-29 ENCOUNTER — Ambulatory Visit (INDEPENDENT_AMBULATORY_CARE_PROVIDER_SITE_OTHER): Payer: Medicaid Other | Admitting: Obstetrics & Gynecology

## 2019-04-29 ENCOUNTER — Other Ambulatory Visit: Payer: Self-pay

## 2019-04-29 ENCOUNTER — Encounter (HOSPITAL_COMMUNITY): Payer: Self-pay | Admitting: *Deleted

## 2019-04-29 VITALS — BP 125/86 | HR 118 | Wt 166.8 lb

## 2019-04-29 DIAGNOSIS — O471 False labor at or after 37 completed weeks of gestation: Secondary | ICD-10-CM | POA: Diagnosis present

## 2019-04-29 DIAGNOSIS — Z3A38 38 weeks gestation of pregnancy: Secondary | ICD-10-CM

## 2019-04-29 DIAGNOSIS — O0990 Supervision of high risk pregnancy, unspecified, unspecified trimester: Secondary | ICD-10-CM

## 2019-04-29 DIAGNOSIS — O479 False labor, unspecified: Secondary | ICD-10-CM

## 2019-04-29 DIAGNOSIS — Z87898 Personal history of other specified conditions: Secondary | ICD-10-CM

## 2019-04-29 DIAGNOSIS — O0993 Supervision of high risk pregnancy, unspecified, third trimester: Secondary | ICD-10-CM

## 2019-04-29 HISTORY — DX: Depression, unspecified: F32.A

## 2019-04-29 NOTE — MAU Note (Signed)
Saw MD today and was 3cm and 50%. Membranes sweeped. Ctxs started couple hrs later since 1300. Ctxs stronger last few hrs. Denies LOF or bleeding

## 2019-04-29 NOTE — Progress Notes (Signed)
   PRENATAL VISIT NOTE  Subjective:  Theresa Johnston is a 28 y.o. G3P1011 at [redacted]w[redacted]d being seen today for ongoing prenatal care.  She is currently monitored for the following issues for this high-risk pregnancy and has Family history of Lynch syndrome; Hypermobile joints; History of poor fetal growth; Supervision of high risk pregnancy, antepartum; and GBS bacteriuria on their problem list.  Patient reports occasional contractions.  Contractions: Not present. Vag. Bleeding: None.  Movement: Present. Denies leaking of fluid.   The following portions of the patient's history were reviewed and updated as appropriate: allergies, current medications, past family history, past medical history, past social history, past surgical history and problem list.   Objective:   Vitals:   04/29/19 1035  BP: 125/86  Pulse: (!) 118  Weight: 166 lb 12.8 oz (75.7 kg)    Fetal Status: Fetal Heart Rate (bpm): 141 Fundal Height: 34 cm Movement: Present  Presentation: Vertex  General:  Alert, oriented and cooperative. Patient is in no acute distress.  Skin: Skin is warm and dry. No rash noted.   Cardiovascular: Normal heart rate noted  Respiratory: Normal respiratory effort, no problems with respiration noted  Abdomen: Soft, gravid, appropriate for gestational age.  Pain/Pressure: Present     Pelvic: Cervical exam performed Dilation: 3 Effacement (%): 60 Station: -3  Extremities: Normal range of motion.  Edema: None  Mental Status: Normal mood and affect. Normal behavior. Normal judgment and thought content.   Assessment and Plan:  Pregnancy: G3P1011 at [redacted]w[redacted]d 1. Supervision of high risk pregnancy, antepartum 2. History of poor fetal growth EFW 23% recently, no further scans indicated. Favorable cervix. Term labor symptoms and general obstetric precautions including but not limited to vaginal bleeding, contractions, leaking of fluid and fetal movement were reviewed in detail with the patient. Please refer to  After Visit Summary for other counseling recommendations.   Return in about 1 week (around 05/06/2019) for Virtual OB Visit.  Future Appointments  Date Time Provider [place]  05/06/2019  3:00 PM Landers Prajapati, Sallyanne Havers, MD [facility]    Verita Schneiders, MD

## 2019-04-29 NOTE — Patient Instructions (Addendum)
Cervical Ripening: May try one or both  Red Raspberry Leaf capsules:  two 300mg or 400mg tablets with each meal, 2-3 times a day  Potential Side Effects Of Raspberry Leaf:  Most women do not experience any side effects from drinking raspberry leaf tea. However, nausea and loose stools are possible     Evening Primrose Oil capsules: may take 1 to 3 capsules daily. May also prick one to release the oil and insert it into your vagina at night.  Some of the potential side effects:  Upset stomach  Loose stools or diarrhea  Headaches  Nausea:      Return to office for any scheduled appointments. Call the office or go to the MAU at [facility] [place] if:  You begin to have strong, frequent contractions  Your water breaks.  Sometimes it is a big gush of fluid, sometimes it is just a trickle that keeps getting your panties wet or running down your legs  You have vaginal bleeding.  It is normal to have a small amount of spotting if your cervix was checked.   You do not feel your baby moving like normal.  If you do not, get something to eat and drink and lay down and focus on feeling your baby move.   If your baby is still not moving like normal, you should call the office or go to MAU.  Any other obstetric concerns.    

## 2019-04-30 ENCOUNTER — Other Ambulatory Visit: Payer: Self-pay

## 2019-04-30 DIAGNOSIS — O471 False labor at or after 37 completed weeks of gestation: Secondary | ICD-10-CM | POA: Diagnosis not present

## 2019-04-30 NOTE — MAU Provider Note (Signed)
Theresa Johnston is a 28 y.o. G59P1011 female at [redacted]w[redacted]d  RN Labor check, not seen by provider SVE by RN: Dilation: 4 Effacement (%): 80 Station: -3 Exam by:: K. WEissRN NST: FHR baseline 135 bpm, Variability: moderate, Accelerations:present, Decelerations:  Absent= Cat 1/Reactive Toco: q 5-86mins  D/C home  Roma Schanz CNM, [facility] 04/30/2019 1:43 AM

## 2019-04-30 NOTE — Discharge Instructions (Signed)
Braxton Hicks Contractions Contractions of the uterus can occur throughout pregnancy, but they are not always a sign that you are in labor. You may have practice contractions called Braxton Hicks contractions. These false labor contractions are sometimes confused with true labor. What are Braxton Hicks contractions? Braxton Hicks contractions are tightening movements that occur in the muscles of the uterus before labor. Unlike true labor contractions, these contractions do not result in opening (dilation) and thinning of the cervix. Toward the end of pregnancy (32-34 weeks), Braxton Hicks contractions can happen more often and may become stronger. These contractions are sometimes difficult to tell apart from true labor because they can be very uncomfortable. You should not feel embarrassed if you go to the hospital with false labor. Sometimes, the only way to tell if you are in true labor is for your health care provider to look for changes in the cervix. The health care provider will do a physical exam and may monitor your contractions. If you are not in true labor, the exam should show that your cervix is not dilating and your water has not broken. If there are no other health problems associated with your pregnancy, it is completely safe for you to be sent home with false labor. You may continue to have Braxton Hicks contractions until you go into true labor. How to tell the difference between true labor and false labor True labor  Contractions last 30-70 seconds.  Contractions become very regular.  Discomfort is usually felt in the top of the uterus, and it spreads to the lower abdomen and low back.  Contractions do not go away with walking.  Contractions usually become more intense and increase in frequency.  The cervix dilates and gets thinner. False labor  Contractions are usually shorter and not as strong as true labor contractions.  Contractions are usually irregular.  Contractions  are often felt in the front of the lower abdomen and in the groin.  Contractions may go away when you walk around or change positions while lying down.  Contractions get weaker and are shorter-lasting as time goes on.  The cervix usually does not dilate or become thin. Follow these instructions at home:   Take over-the-counter and prescription medicines only as told by your health care provider.  Keep up with your usual exercises and follow other instructions from your health care provider.  Eat and drink lightly if you think you are going into labor.  If Braxton Hicks contractions are making you uncomfortable: ? Change your position from lying down or resting to walking, or change from walking to resting. ? Sit and rest in a tub of warm water. ? Drink enough fluid to keep your urine pale yellow. Dehydration may cause these contractions. ? Do slow and deep breathing several times an hour.  Keep all follow-up prenatal visits as told by your health care provider. This is important. Contact a health care provider if:  You have a fever.  You have continuous pain in your abdomen. Get help right away if:  Your contractions become stronger, more regular, and closer together.  You have fluid leaking or gushing from your vagina.  You pass blood-tinged mucus (bloody show).  You have bleeding from your vagina.  You have low back pain that you never had before.  You feel your baby's head pushing down and causing pelvic pressure.  Your baby is not moving inside you as much as it used to. Summary  Contractions that occur before labor are   called Braxton Hicks contractions, false labor, or practice contractions.  Braxton Hicks contractions are usually shorter, weaker, farther apart, and less regular than true labor contractions. True labor contractions usually become progressively stronger and regular, and they become more frequent.  Manage discomfort from Braxton Hicks contractions  by changing position, resting in a warm bath, drinking plenty of water, or practicing deep breathing. This information is not intended to replace advice given to you by your health care provider. Make sure you discuss any questions you have with your health care provider. Document Released: 12/07/2016 Document Revised: 07/06/2017 Document Reviewed: 12/07/2016 Elsevier Patient Education  2020 [employer] Inc[employer]  

## 2019-05-02 ENCOUNTER — Inpatient Hospital Stay (HOSPITAL_COMMUNITY): Payer: Medicaid Other | Admitting: Anesthesiology

## 2019-05-02 ENCOUNTER — Other Ambulatory Visit: Payer: Self-pay

## 2019-05-02 ENCOUNTER — Inpatient Hospital Stay (HOSPITAL_COMMUNITY)
Admission: AD | Admit: 2019-05-02 | Discharge: 2019-05-03 | DRG: 807 | Disposition: A | Discharge status: Home / Self Care | Payer: Medicaid Other | Attending: Family Medicine | Admitting: Family Medicine

## 2019-05-02 ENCOUNTER — Encounter (HOSPITAL_COMMUNITY): Payer: Self-pay

## 2019-05-02 DIAGNOSIS — O26893 Other specified pregnancy related conditions, third trimester: Secondary | ICD-10-CM | POA: Diagnosis present

## 2019-05-02 DIAGNOSIS — O0990 Supervision of high risk pregnancy, unspecified, unspecified trimester: Secondary | ICD-10-CM

## 2019-05-02 DIAGNOSIS — O99824 Streptococcus B carrier state complicating childbirth: Principal | ICD-10-CM | POA: Diagnosis present

## 2019-05-02 DIAGNOSIS — R8271 Bacteriuria: Secondary | ICD-10-CM | POA: Diagnosis present

## 2019-05-02 DIAGNOSIS — Z3A38 38 weeks gestation of pregnancy: Secondary | ICD-10-CM

## 2019-05-02 DIAGNOSIS — Z20828 Contact with and (suspected) exposure to other viral communicable diseases: Secondary | ICD-10-CM | POA: Diagnosis present

## 2019-05-02 LAB — CBC
HCT: 33.2 % — ABNORMAL LOW (ref 36.0–46.0)
Hemoglobin: 11.3 g/dL — ABNORMAL LOW (ref 12.0–15.0)
MCH: 29.4 pg (ref 26.0–34.0)
MCHC: 34 g/dL (ref 30.0–36.0)
MCV: 86.2 fL (ref 80.0–100.0)
Platelets: 206 10*3/uL (ref 150–400)
RBC: 3.85 MIL/uL — ABNORMAL LOW (ref 3.87–5.11)
RDW: 13.3 % (ref 11.5–15.5)
WBC: 11.2 10*3/uL — ABNORMAL HIGH (ref 4.0–10.5)
nRBC: 0 % (ref 0.0–0.2)

## 2019-05-02 LAB — ABO/RH: ABO/RH(D): O POS

## 2019-05-02 LAB — RPR: RPR Ser Ql: NONREACTIVE

## 2019-05-02 LAB — SARS CORONAVIRUS 2 BY RT PCR (HOSPITAL ORDER, PERFORMED IN ~~LOC~~ HOSPITAL LAB): SARS Coronavirus 2: NEGATIVE

## 2019-05-02 LAB — TYPE AND SCREEN
ABO/RH(D): O POS
Antibody Screen: NEGATIVE

## 2019-05-02 MED ORDER — SODIUM CHLORIDE 0.9 % IV SOLN
5.0000 10*6.[IU] | Freq: Once | INTRAVENOUS | Status: AC
  Administered 2019-05-02: 5 10*6.[IU] via INTRAVENOUS
  Filled 2019-05-02: qty 5

## 2019-05-02 MED ORDER — FENTANYL CITRATE (PF) 100 MCG/2ML IJ SOLN: 50.0000 ug | INTRAMUSCULAR | Status: DC | PRN

## 2019-05-02 MED ORDER — OXYTOCIN BOLUS FROM INFUSION
500.0000 mL | Freq: Once | INTRAVENOUS | Status: AC
  Administered 2019-05-02: 500 mL via INTRAVENOUS

## 2019-05-02 MED ORDER — DIBUCAINE (PERIANAL) 1 % EX OINT: 1.0000 "application " | TOPICAL_OINTMENT | CUTANEOUS | Status: DC | PRN

## 2019-05-02 MED ORDER — FLEET ENEMA 7-19 GM/118ML RE ENEM: 1.0000 | ENEMA | RECTAL | Status: DC | PRN

## 2019-05-02 MED ORDER — SOD CITRATE-CITRIC ACID 500-334 MG/5ML PO SOLN: 30.0000 mL | ORAL | Status: DC | PRN

## 2019-05-02 MED ORDER — LACTATED RINGERS IV SOLN
INTRAVENOUS | Status: DC
  Administered 2019-05-02 (×3): via INTRAVENOUS

## 2019-05-02 MED ORDER — SIMETHICONE 80 MG PO CHEW: 80.0000 mg | CHEWABLE_TABLET | ORAL | Status: DC | PRN

## 2019-05-02 MED ORDER — TETANUS-DIPHTH-ACELL PERTUSSIS 5-2.5-18.5 LF-MCG/0.5 IM SUSP: 0.5000 mL | Freq: Once | INTRAMUSCULAR | Status: DC

## 2019-05-02 MED ORDER — FENTANYL-BUPIVACAINE-NACL 0.5-0.125-0.9 MG/250ML-% EP SOLN: 12.0000 mL/h | EPIDURAL | Status: DC | PRN

## 2019-05-02 MED ORDER — LIDOCAINE HCL (PF) 1 % IJ SOLN: 30.0000 mL | INTRAMUSCULAR | Status: DC | PRN

## 2019-05-02 MED ORDER — LACTATED RINGERS IV SOLN
500.0000 mL | Freq: Once | INTRAVENOUS | Status: AC
  Administered 2019-05-02: 500 mL via INTRAVENOUS

## 2019-05-02 MED ORDER — LIDOCAINE HCL (PF) 1 % IJ SOLN
INTRAMUSCULAR | Status: DC | PRN
  Administered 2019-05-02 (×2): 4 mL via EPIDURAL

## 2019-05-02 MED ORDER — PHENYLEPHRINE 40 MCG/ML (10ML) SYRINGE FOR IV PUSH (FOR BLOOD PRESSURE SUPPORT): 80.0000 ug | PREFILLED_SYRINGE | INTRAVENOUS | Status: DC | PRN

## 2019-05-02 MED ORDER — OXYCODONE-ACETAMINOPHEN 5-325 MG PO TABS: 2.0000 | ORAL_TABLET | ORAL | Status: DC | PRN

## 2019-05-02 MED ORDER — ZOLPIDEM TARTRATE 5 MG PO TABS: 5.0000 mg | ORAL_TABLET | Freq: Every evening | ORAL | Status: DC | PRN

## 2019-05-02 MED ORDER — ACETAMINOPHEN 325 MG PO TABS: 650.0000 mg | ORAL_TABLET | ORAL | Status: DC | PRN

## 2019-05-02 MED ORDER — SENNOSIDES-DOCUSATE SODIUM 8.6-50 MG PO TABS
2.0000 | ORAL_TABLET | ORAL | Status: DC
  Administered 2019-05-02: 2 via ORAL
  Filled 2019-05-02: qty 2

## 2019-05-02 MED ORDER — ONDANSETRON HCL 4 MG/2ML IJ SOLN: 4.0000 mg | Freq: Four times a day (QID) | INTRAMUSCULAR | Status: DC | PRN

## 2019-05-02 MED ORDER — DIPHENHYDRAMINE HCL 50 MG/ML IJ SOLN: 12.5000 mg | INTRAMUSCULAR | Status: DC | PRN

## 2019-05-02 MED ORDER — PENICILLIN G 3 MILLION UNITS IVPB - SIMPLE MED
3.0000 10*6.[IU] | INTRAVENOUS | Status: DC
  Administered 2019-05-02: 3 10*6.[IU] via INTRAVENOUS
  Filled 2019-05-02: qty 100

## 2019-05-02 MED ORDER — DIPHENHYDRAMINE HCL 25 MG PO CAPS: 25.0000 mg | ORAL_CAPSULE | Freq: Four times a day (QID) | ORAL | Status: DC | PRN

## 2019-05-02 MED ORDER — OXYTOCIN 40 UNITS IN NORMAL SALINE INFUSION - SIMPLE MED
2.5000 [IU]/h | INTRAVENOUS | Status: DC
  Filled 2019-05-02: qty 1000

## 2019-05-02 MED ORDER — COCONUT OIL OIL: 1.0000 "application " | TOPICAL_OIL | Status: DC | PRN

## 2019-05-02 MED ORDER — LACTATED RINGERS IV SOLN: 500.0000 mL | INTRAVENOUS | Status: DC | PRN

## 2019-05-02 MED ORDER — WITCH HAZEL-GLYCERIN EX PADS: 1.0000 "application " | MEDICATED_PAD | CUTANEOUS | Status: DC | PRN

## 2019-05-02 MED ORDER — EPHEDRINE 5 MG/ML INJ: 10.0000 mg | INTRAVENOUS | Status: DC | PRN

## 2019-05-02 MED ORDER — HYDROXYZINE HCL 50 MG PO TABS: 50.0000 mg | ORAL_TABLET | Freq: Four times a day (QID) | ORAL | Status: DC | PRN

## 2019-05-02 MED ORDER — FENTANYL-BUPIVACAINE-NACL 0.5-0.125-0.9 MG/250ML-% EP SOLN
12.0000 mL/h | EPIDURAL | Status: DC | PRN
  Filled 2019-05-02: qty 250

## 2019-05-02 MED ORDER — SODIUM CHLORIDE (PF) 0.9 % IJ SOLN
INTRAMUSCULAR | Status: DC | PRN
  Administered 2019-05-02: 12 mL/h via EPIDURAL

## 2019-05-02 MED ORDER — BENZOCAINE-MENTHOL 20-0.5 % EX AERO: 1.0000 "application " | INHALATION_SPRAY | CUTANEOUS | Status: DC | PRN

## 2019-05-02 MED ORDER — IBUPROFEN 600 MG PO TABS
600.0000 mg | ORAL_TABLET | Freq: Four times a day (QID) | ORAL | Status: DC
  Administered 2019-05-02 – 2019-05-03 (×4): 600 mg via ORAL
  Filled 2019-05-02 (×4): qty 1

## 2019-05-02 MED ORDER — OXYCODONE-ACETAMINOPHEN 5-325 MG PO TABS: 1.0000 | ORAL_TABLET | ORAL | Status: DC | PRN

## 2019-05-02 MED ORDER — ONDANSETRON HCL 4 MG PO TABS: 4.0000 mg | ORAL_TABLET | ORAL | Status: DC | PRN

## 2019-05-02 MED ORDER — ONDANSETRON HCL 4 MG/2ML IJ SOLN: 4.0000 mg | INTRAMUSCULAR | Status: DC | PRN

## 2019-05-02 MED ORDER — PRENATAL MULTIVITAMIN CH
1.0000 | ORAL_TABLET | Freq: Every day | ORAL | Status: DC
  Administered 2019-05-03: 1 via ORAL
  Filled 2019-05-02: qty 1

## 2019-05-02 NOTE — Progress Notes (Signed)
Labor Progress Note Nadiah Corbit is a 28 y.o. G3P1011 at [redacted]w[redacted]d presented for SOL. S: Feeling comfortable with Epidural.   O:  BP 117/62   Pulse 85   Temp 97.8 F (36.6 C) (Oral)   Resp 16   Ht 5\' 7"  (1.702 m)   Wt 74.8 kg   LMP 08/06/2018 (Exact Date)   SpO2 98%   BMI 25.84 kg/m  EFM: 140, moderate variability, reactive, pos accels, no decels Ctx: q11m  CVE: Dilation: 8 Effacement (%): 100 Cervical Position: Middle Station: 0 Presentation: Vertex Exam by:: Rosana Hoes, RN    A&P: 28 y.o. [id number] [redacted]w[redacted]d here for SOL.  #Labor: Progressing well. AROM with clear fluid at this check. Expectant management. Anticipate SVD. #Pain: Epidural  #FWB: Cat I, EFW 3200g #GBS positive; PCN since [id number]  Chauncey Mann, MD 9:15 AM

## 2019-05-02 NOTE — MAU Note (Signed)
Irreg ctxs all day. Closer and stronger since MN. Denies LOF or bleeding

## 2019-05-02 NOTE — Anesthesia Preprocedure Evaluation (Signed)
Anesthesia Evaluation  Patient identified by MRN, date of birth, ID band Patient awake    Reviewed: Allergy & Precautions, NPO status , Patient's Chart, lab work & pertinent test results  Airway Mallampati: II  TM Distance: >3 FB Neck ROM: Full    Dental  (+) Dental Advisory Given   Pulmonary neg pulmonary ROS,    Pulmonary exam normal breath sounds clear to auscultation       Cardiovascular negative cardio ROS Normal cardiovascular exam Rhythm:Regular Rate:Normal     Neuro/Psych negative neurological ROS  negative psych ROS   GI/Hepatic negative GI ROS, Neg liver ROS,   Endo/Other  negative endocrine ROS  Renal/GU negative Renal ROS     Musculoskeletal negative musculoskeletal ROS (+)   Abdominal   Peds  Hematology  (+) anemia ,   Anesthesia Other Findings   Reproductive/Obstetrics (+) Pregnancy                             Anesthesia Physical Anesthesia Plan  ASA: II  Anesthesia Plan: Epidural   Post-op Pain Management:    Induction:   PONV Risk Score and Plan:   Airway Management Planned:   Additional Equipment:   Intra-op Plan:   Post-operative Plan: Extubation in OR  Informed Consent:   Plan Discussed with:   Anesthesia Plan Comments:         Anesthesia Quick Evaluation

## 2019-05-02 NOTE — MAU Note (Signed)
GBS pos in urine on 12/02/18.  Gilmer Mor RN

## 2019-05-02 NOTE — H&P (Signed)
Theresa Johnston is a 28 y.o. female [phone] with IUP at [redacted]w[redacted]d presenting for contractions. Pt states she has been having regular, every 4-5 minutes contractions, associated with none vaginal bleeding for several hours..  Membranes are intact, with active fetal movement.   [facility]  Prenatal History/Complications:   Hx IUGR EFW 23% 04/15/19  Past Medical History: Past Medical History:  Diagnosis Date  . Anemia   . Depression   . Medical history non-contributory   . SVT (supraventricular tachycardia) (HCC) 2017    Past Surgical History: Past Surgical History:  Procedure Laterality Date  . ABLATION     SVT  . CHOLECYSTECTOMY  2017    Obstetrical History: OB History    Gravida  3   Para  1   Term  1   Preterm      AB  1   Living  1     SAB  1   TAB      Ectopic      Multiple      Live Births  1            Social History: Social History   Socioeconomic History  . Marital status: Married    Spouse name: Not on file  . Number of children: Not on file  . Years of education: Not on file  . Highest education level: Not on file  Occupational History  . Not on file  Social Needs  . Financial resource strain: Not on file  . Food insecurity    Worry: Not on file    Inability: Not on file  . Transportation needs    Medical: Not on file    Non-medical: Not on file  Tobacco Use  . Smoking status: Never Smoker  . Smokeless tobacco: Current User  Substance and Sexual Activity  . Alcohol use: Not Currently  . Drug use: Not Currently    Types: Marijuana    Comment: not in past 5 years  . Sexual activity: Yes    Birth control/protection: None  Lifestyle  . Physical activity    Days per week: Not on file    Minutes per session: Not on file  . Stress: Not on file  Relationships  . Social Musician on phone: Not on file    Gets together: Not on file    Attends religious service: Not on file    Active member of club or organization:  Not on file    Attends meetings of clubs or organizations: Not on file    Relationship status: Not on file  Other Topics Concern  . Not on file  Social History Narrative  . Not on file    Family History: History reviewed. No pertinent family history.  Allergies: No Known Allergies  Medications Prior to Admission  Medication Sig Dispense Refill Last Dose  . Prenatal Vit-Fe Fumarate-FA (MULTIVITAMIN-PRENATAL) 27-0.8 MG TABS tablet Take 1 tablet by mouth daily at 12 noon.           Review of Systems   Constitutional: Negative for fever and chills Eyes: Negative for visual disturbances Respiratory: Negative for shortness of breath, dyspnea Cardiovascular: Negative for chest pain or palpitations  Gastrointestinal: Negative for vomiting, diarrhea and constipation.  POSITIVE for abdominal pain (contractions) Genitourinary: Negative for dysuria and urgency Musculoskeletal: Negative for back pain, joint pain, myalgias  Neurological: Negative for dizziness and headaches      Blood pressure 131/85, pulse (!) 101, temperature 98  F (36.7 C), resp. rate 18, height 5\' 7"  (1.702 m), weight 74.8 kg, last menstrual period 08/06/2018. General appearance: alert, cooperative and no distress Lungs: normal respiratory effort Heart: regular rate and rhythm Abdomen: soft, non-tender; bowel sounds normal Extremities: Homans sign is negative, no sign of DVT DTR's 2+ Presentation: cephalic Fetal monitoring  Baseline: 140 bpm, Variability: Good {> 6 bpm), Accelerations: Reactive and Decelerations: Absent Uterine activity  4-5 Dilation: 4.5(4.5-5) Effacement (%): 80, 70 Station: -1 Exam by:: Gilmer Mor RN   Prenatal labs: ABO, Rh: O/Positive/-- (04/27 1404) Antibody: Negative (04/27 1404) Rubella: 5.61 (04/27 1404) RPR: Non Reactive (07/14 0820)  HBsAg: Negative (04/27 1404)  HIV: Non Reactive (07/14 0820)    Nursing Staff Provider  Office Location  [place] Dating    Language   English Anatomy US   neg  Flu Vaccine   Genetic Screen  Ndeclined  TDaP vaccine   02/18/2019 Hgb A1C or  GTT Early  Third trimester 76/104/81  Rhogam  N/A   LAB RESULTS   Feeding Plan Breast  Blood Type O/Positive/-- (04/27 1404) O pos  Contraception Natural  Antibody Negative (04/27 1404)neg  Circumcision N/A Rubella 5.61 (04/27 1404)imm  Pediatrician  [facility] RPR Non Reactive (04/27 1404) neg  Support Person Husband  HBsAg Negative (04/27 1404) neg  Prenatal Classes  HIV Non Reactive (04/27 1404)neg  BTL Consent  GBS  Positive in urine  VBAC Consent  Pap  neg 2020    Hgb Electro      CF     SMA     Waterbirth  [ ]  Class [ ]  Consent [ ]  CNM visit       Prenatal Transfer Tool  Maternal Diabetes: No Genetic Screening: Declined Maternal Ultrasounds/Referrals: Normal Fetal Ultrasounds or other Referrals:  None Maternal Substance Abuse:  No Significant Maternal Medications:  None Significant Maternal Lab Results: Group B Strep positive     No results found for this or any previous visit (from the past 24 hour(s)).  Assessment: Theresa Johnston is a 28 y.o. [phone] with an IUP at [redacted]w[redacted]d presenting for active labor  Plan: #Labor: expectant management #Pain:  Per request #FWB Cat 1 #ID: GBS: PCN   Christin Fudge 05/02/2019, 3:17 AM

## 2019-05-02 NOTE — Anesthesia Postprocedure Evaluation (Signed)
Anesthesia Post Note  Patient: Theresa Johnston  Procedure(s) Performed: AN AD HOC LABOR EPIDURAL     Patient location during evaluation: Mother Baby Anesthesia Type: Epidural Level of consciousness: awake and alert and oriented Pain management: satisfactory to patient Vital Signs Assessment: post-procedure vital signs reviewed and stable Respiratory status: spontaneous breathing and nonlabored ventilation Cardiovascular status: stable Postop Assessment: no headache, no backache, no signs of nausea or vomiting, adequate PO intake, patient able to bend at knees and able to ambulate (patient up walking) Anesthetic complications: no    Last Vitals:  Vitals:   05/02/19 1200 05/02/19 1300  BP: 123/72 105/80  Pulse: 68 67  Resp: 18 16  Temp: 36.6 C 36.9 C  SpO2:  100%    Last Pain:  Vitals:   05/02/19 1300  TempSrc: Oral  PainSc: 0-No pain   Pain Goal: Patients Stated Pain Goal: 0 (05/02/19 0150)                 Willa Rough

## 2019-05-02 NOTE — Lactation Note (Signed)
This note was copied from a baby's chart. Lactation Consultation Note  Patient Name: Theresa Johnston [id number] Date: 05/02/2019 Reason for consult: Initial assessment;Early term 37-38.6wks;Primapara;1st time breastfeeding  P1 mother whose infant is now 25 hours old.  This is an ETI at 38+3 weeks. Mother breast fed her first child for only three weeks.  She had many life events happening at that time and was not stress free which caused a decrease in her milk supply.  It was better for mother to switch to formula.  She hopes to breast feed exclusively with this child.  Due to a history of needing phototherapy with her first child, ETI and a lower milk supply I offered to initiate the DEBP for her.  Mother was very interested in starting to pump.  Pump parts, assembly, disassembly and cleaning reviewed.  #24 flange size is too small and flange size increased to a #27 for greater comfort and fit.  Observed mother pumping for 15 minutes while reviewing breast feeding basics.  At the end of the pumping session mother was able to obtain a couple drops of colostrum which were rubbed into her nipples/areolas.  Mother also has her own organic nipple cream at bedside.  Encouraged to feed 8-12 times/24 hours or sooner if baby shows feeding cues.  Reviewed cues.  Colostrum container provided and milk storage times discussed.  Finger feeding demonstrated.    Mom made aware of O/P services, breastfeeding support groups, community resources, and our phone # for post-discharge questions. Mother will call for latch assistance as needed.  Father present.   Maternal Data Formula Feeding for Exclusion: No Has patient been taught Hand Expression?: Yes Does the patient have breastfeeding experience prior to this delivery?: No  Feeding    LATCH Score                   Interventions    Lactation Tools Discussed/Used Pump Review: Setup, frequency, and cleaning;Milk Storage Initiated by:: Paul Dykes Date initiated:: 05/03/19   Consult Status Consult Status: Follow-up Date: 05/03/19 Follow-up type: In-patient    Theresa Johnston 05/02/2019, 5:19 PM

## 2019-05-02 NOTE — Discharge Summary (Addendum)
Postpartum Discharge Summary    Patient Name: Theresa Johnston DOB: 06-Apr-1991 MRN: [id number]  Date of admission: 05/02/2019 Delivering Provider: Drinda Butts E   Date of discharge: 05/03/2019  Admitting diagnosis: 38 WKS, CTX Intrauterine pregnancy: [redacted]w[redacted]d    Secondary diagnosis:  Active Problems:   GBS bacteriuria   Indication for care in labor or delivery   [redacted] weeks gestation of pregnancy  Additional problems: None     Discharge diagnosis: Term Pregnancy Delivered                                                                                                Post partum procedures:None  Augmentation: AROM  Complications: None  Hospital course:  Onset of Labor With Vaginal Delivery     28y.o. yo G3P1011 at 26w3das admitted in Active Labor on 05/02/2019. Patient had an uncomplicated labor course. She had AROM performed and received an Epidural. Received adequate GBS ppx. She compressed to complete with uncomplicated delivery.   Membrane Rupture Time/Date: 9:12 AM ,05/02/2019   Intrapartum Procedures: Episiotomy: None [1]                                         Lacerations:    Patient had a delivery of a Viable infant. 05/02/2019  Information for the patient's newborn:  CoHadassah, Rana0[[id number]]Delivery Method: Vaginal, Spontaneous(Filed from Delivery Summary)     Pateint had an uncomplicated postpartum course.  She is ambulating, tolerating a regular diet, passing flatus, and urinating well. Patient is discharged home in stable condition on 05/03/19.  Delivery time: 10:26 AM    Magnesium Sulfate received: No BMZ received: No Rhophylac:No MMR:No Transfusion:No  Physical exam  Vitals:   05/02/19 1300 05/02/19 1718 05/02/19 2024 05/03/19 0544  BP: 105/80 120/83 130/84 110/82  Pulse: 67 74 95 68  Resp: _0 Temp: 98.5 F (36.9 C) 97.7 F (36.5 C) 97.9 F (36.6 C) 98 F (36.7 C)  TempSrc: Oral Oral Oral Oral  SpO2: 100% 100% 99% 100%   Weight:      Height:       General: alert, cooperative and no distress  Cardiac: RRR, no rubs or gallps  Resp: chest clear, no crackles or wheeze, no respiratory distress GI: abdo mildy tender, no guarding, bowel sounds present  Lochia: appropriate Uterine Fundus: firm Incision: N/A DVT Evaluation: No cords or calf tenderness. No significant calf/ankle edema.  Labs: Lab Results  Component Value Date   WBC 11.2 (H) 05/02/2019   HGB 11.3 (L) 05/02/2019   HCT 33.2 (L) 05/02/2019   MCV 86.2 05/02/2019   PLT 206 05/02/2019   No flowsheet data found.  Discharge instruction: per After Visit Summary and "Baby and Me Booklet".  After visit meds:  Allergies as of 05/03/2019   No Known Allergies     Medication List    TAKE these medications   acetaminophen 325 MG tablet Commonly known as: Tylenol Take 2 tablets (650  mg total) by mouth every 4 (four) hours as needed (for pain scale < 4).   calcium carbonate 500 MG chewable tablet Commonly known as: TUMS - dosed in mg elemental calcium Chew 2 tablets by mouth as needed for indigestion or heartburn.   ibuprofen 600 MG tablet Commonly known as: ADVIL Take 1 tablet (600 mg total) by mouth every 6 (six) hours.   multivitamin-prenatal 27-0.8 MG Tabs tablet Take 1 tablet by mouth daily at 12 noon.   senna-docusate 8.6-50 MG tablet Commonly known as: Senokot-S Take 2 tablets by mouth 2 (two) times daily.       Diet: routine diet  Activity: Advance as tolerated. Pelvic rest for 6 weeks.   Outpatient follow up:4 weeks Follow up Appt: No future appointments. Follow up Visit:   Please schedule this patient for Postpartum visit in: 4 weeks with the following provider: Any provider Low risk pregnancy complicated by: None Delivery mode:  SVD Anticipated Birth Control:  NFP PP Procedures needed: None  Schedule Integrated BH visit: no      Newborn Data: Live born female  Birth Weight: 3010g APGAR: 70, 9  Newborn  Delivery   Birth date/time: 05/02/2019 10:26:00 Delivery type: Vaginal, Spontaneous      Baby Feeding: Breast Disposition:home with mother   05/03/2019 Lattie Haw, MD    Provider attestation I have seen and examined this patient and agree with above documentation in the resident's note.   Theresa Johnston is a 28 y.o. [id number] s/p SVD.  Pain is well controlled. Plan for birth control is natural family planning (NFP). Method of Feeding: breast  PE:  Gen: well appearing Heart: reg rate Lungs: normal WOB Fundus firm Ext: no pain, no edema  Recent Labs    05/02/19 0320  HGB 11.3*  HCT 33.2*    Assessment S/p SVD PPD # 1  Plan: - discharge home - postpartum care discussed - f/u in office in 6 weeks for postpartum visit   Jorje Guild, NP 10:48 AM

## 2019-05-02 NOTE — Anesthesia Procedure Notes (Signed)
Epidural Patient location during procedure: OB  Staffing Anesthesiologist: Leonette Tischer, MD Performed: anesthesiologist   Preanesthetic Checklist Completed: patient identified, pre-op evaluation, timeout performed, IV checked, risks and benefits discussed and monitors and equipment checked  Epidural Patient position: sitting Prep: site prepped and draped and DuraPrep Patient monitoring: heart rate, continuous pulse ox and blood pressure Approach: midline Location: L3-L4 Injection technique: LOR air and LOR saline  Needle:  Needle type: Tuohy  Needle gauge: 17 G Needle length: 9 cm Needle insertion depth: 5 cm Catheter type: closed end flexible Catheter size: 19 Gauge Catheter at skin depth: 10 cm Test dose: negative  Assessment Sensory level: T8 Events: blood not aspirated, injection not painful, no injection resistance, negative IV test and no paresthesia  Additional Notes Reason for block:procedure for pain     

## 2019-05-03 MED ORDER — IBUPROFEN 600 MG PO TABS: 600.0000 mg | ORAL_TABLET | Freq: Four times a day (QID) | ORAL | 0 refills | Status: DC

## 2019-05-03 MED ORDER — SENNOSIDES-DOCUSATE SODIUM 8.6-50 MG PO TABS: 2.0000 | ORAL_TABLET | Freq: Two times a day (BID) | ORAL | 0 refills | Status: AC

## 2019-05-03 MED ORDER — ACETAMINOPHEN 325 MG PO TABS: 650.0000 mg | ORAL_TABLET | ORAL | 0 refills | Status: DC | PRN

## 2019-05-03 NOTE — Progress Notes (Signed)
CSW received consult for hx Depression.  CSW met with MOB to offer support and complete assessment.    CSW congratulated MOB and FOB on the birth of infant Theresa Johnston. CSW advised MOB of CSW's role and the reason for the visit. MOB reported that she has never been medically diagnosed with depression. MOB does report that she has dealt with depression and some anxiety since age 26-14. MOB expressed never being placed on medications or in therapy. MOB expressed that it has been awhile since she dealt with either. MOB has older daughter in  which she suggested she dealt with PPD. MOB again, reported that she was never diagnosed with it but thinks she ay have had it as she felt different emotions at that time. MOB currently denies feeling SI or HI and reported that she has been feeling great since giving birth.   CSW provided education regarding the baby blues period vs. perinatal mood disorders, discussed treatment and gave resources for mental health follow up if concerns arise.  CSW recommends self-evaluation during the postpartum time period using the New Mom Checklist from Postpartum Progress and encouraged MOB to contact a medical professional if symptoms are noted at any time.   CSW provided review of Sudden Infant Death Syndrome (SIDS) precautions.CSW spoke with MOB and FOB regarding items and they report that they have all needed items to care for infant at this time.    CSW identifies no further need for intervention and no barriers to discharge at this time.    Theresa Johnston, MSW, LCSW [facility] [place] [facility] [place] [phone]

## 2019-05-03 NOTE — Discharge Instructions (Signed)

## 2019-05-06 ENCOUNTER — Telehealth: Payer: Medicaid Other | Admitting: Obstetrics & Gynecology

## 2019-05-27 ENCOUNTER — Other Ambulatory Visit: Payer: Self-pay

## 2019-05-27 ENCOUNTER — Other Ambulatory Visit (HOSPITAL_COMMUNITY)
Admission: RE | Admit: 2019-05-27 | Discharge: 2019-05-27 | Disposition: A | Discharge status: Home / Self Care | Payer: Medicaid Other | Source: Ambulatory Visit | Attending: Family Medicine | Admitting: Family Medicine

## 2019-05-27 ENCOUNTER — Ambulatory Visit (INDEPENDENT_AMBULATORY_CARE_PROVIDER_SITE_OTHER): Payer: Medicaid Other | Admitting: Family Medicine

## 2019-05-27 DIAGNOSIS — Z3045 Encounter for surveillance of transdermal patch hormonal contraceptive device: Secondary | ICD-10-CM

## 2019-05-27 DIAGNOSIS — N898 Other specified noninflammatory disorders of vagina: Secondary | ICD-10-CM | POA: Diagnosis present

## 2019-05-27 DIAGNOSIS — Z1389 Encounter for screening for other disorder: Secondary | ICD-10-CM | POA: Diagnosis not present

## 2019-05-27 MED ORDER — NORELGESTROMIN-ETH ESTRADIOL 150-35 MCG/24HR TD PTWK: 1.0000 | MEDICATED_PATCH | TRANSDERMAL | 3 refills | Status: AC

## 2019-05-27 NOTE — Progress Notes (Signed)
Post Partum Exam  Theresa Johnston is a 28 y.o. [phone] female who presents for a postpartum visit. She is three weeks postpartum following a vaginal delivery 05/02/2019. I have fully reviewed the prenatal and intrapartum course. The delivery was at 38.2 gestational weeks. Anesthesia: Epidual. Postpartum course has been uncomplicated. Baby's course has been uncomplicated. Baby is feeding by bottle. Bleeding not at this time. Bowel function is normal. Bladder function is normal. Patient is not sexually active. Contraception method is discuss birth control patch. Postpartum depression screening:neg I have independently reviewed the note above and agree with findings. She reports vaginal discharge, odor, vaginal irritation, has h/o BV, feels similar.  The following portions of the patient's history were reviewed and updated as appropriate: allergies, current medications, past family history, past medical history, past social history, past surgical history and problem list. Last pap smear done 11/25/2018 and was normal  Review of Systems Pertinent items noted in HPI and remainder of comprehensive ROS otherwise negative.    Objective:  unknown if currently breastfeeding.  General:  alert, cooperative and appears stated age  Lungs: normal effort  Heart:  regular rate and rhythm  Abdomen: soft, non-tender; bowel sounds normal; no masses,  no organomegaly   Vulva:  normal        Assessment:    Normal postpartum exam. Pap smear not done at today's visit.   Plan:   1. Contraception: Ortho-Evra patches weekly 2. Pap due 2023 3. Check vaginal cultures and treat appropriately. 4. Follow up in: 3 months or as needed.

## 2019-06-04 LAB — CERVICOVAGINAL ANCILLARY ONLY
Bacterial Vaginitis (gardnerella): NEGATIVE
Candida Glabrata: NEGATIVE
Candida Vaginitis: NEGATIVE
Comment: NEGATIVE
Comment: NEGATIVE
Comment: NEGATIVE

## 2019-08-11 ENCOUNTER — Telehealth: Payer: Medicaid Other | Admitting: Physician Assistant

## 2019-08-11 DIAGNOSIS — U071 COVID-19: Secondary | ICD-10-CM

## 2019-08-11 DIAGNOSIS — R059 Cough, unspecified: Secondary | ICD-10-CM

## 2019-08-11 DIAGNOSIS — R05 Cough: Secondary | ICD-10-CM

## 2019-08-11 MED ORDER — PROMETHAZINE-DM 6.25-15 MG/5ML PO SYRP: 5.0000 mL | ORAL_SOLUTION | Freq: Four times a day (QID) | ORAL | 0 refills | Status: DC | PRN

## 2019-08-11 MED ORDER — BENZONATATE 100 MG PO CAPS: 100.0000 mg | ORAL_CAPSULE | Freq: Three times a day (TID) | ORAL | 0 refills | Status: DC | PRN

## 2019-08-11 NOTE — Progress Notes (Signed)
[place],   I am sorry but corticosteroids are not indicated for the treatment of your symptoms.    You have been enrolled in [facility] for COVID-19. Daily you will receive a questionnaire within the [facility] website. Our COVID-19 response team will be monitoring your responses daily.  You can use medication such as A prescription cough medication called Tessalon Perles 100 mg. You may take 1-2 capsules every 8 hours as needed for cough and A prescription cough medication called Phenergan DM 6.25 mg/15 mg. You make take one teaspoon / 5 ml every 4-6 hours as needed for cough  You may also take acetaminophen (Tylenol) as needed for fever.  -Foods that can help speed recovery: honey, garlic, chicken soup, elderberries, green tea.  -Supplements that can help speed recovery: vitamin C, zinc, elderberry extract -Supplement with prebiotics and probiotics.  Advil or ibuprofen for pain. Do not take Aspirin.  Drink enough water and fluids to keep your urine clear or pale yellow.  For sore throat: ? Gargle with 8 oz of salt water ( tsp of salt per 1 qt of water) as often as every 1-2 hours to soothe your throat.  Gargle liquid benadryl.  Cepacol throat lozenges (if you are not at risk for choking).  For sore throat try using a honey-based tea. Use 3 teaspoons of honey with juice squeezed from half lemon. Place shaved pieces of ginger into 1/2-1 cup of water and warm over stove top. Then mix the ingredients and repeat every 4 hours as needed.  Cough Syrup Recipe: Sweet Lemon & Honey Thyme  Ingredients . a handful of fresh thyme sprigs   . 1 pint of water (2 cups)  . 1/2 cup honey (raw is best, but regular will do)  . 1/2 lemon chopped Instructions 1. Place the lemon in the pint jar and cover with the honey. The honey will macerate the lemons and draw out liquids which taste so delicious! 2. Meanwhile, toss the thyme leaves into a saucepan and cover them with the water. 3. Bring  the water to a gentle simmer and reduce it to half, about a cup of tea. 4. When the tea is reduced and cooled a bit, strain the sprigs & leaves, add it into the pint jar and stir it well. 5. Give it a shake and use a spoonful as needed. 6. Store your homemade cough syrup in the refrigerator for about a month.  Is there anything I can do on my own to get rid of my cough?  Yes. To help get rid of your cough, you can: ?Use a humidifier in your bedroom ?Use an over-the-counter cough medicine, or suck on cough drops or hard candy ?Stop smoking, if you smoke ?If you have allergies, avoid the things you are allergic to (like pollen, dust, animals, or mold) If you have acid reflux, your doctor or nurse will tell you which lifestyle changes can help reduce symptoms.    Please continue isolation at home, for at least 10 days since the start of your symptoms and until you have had 24 hours with no fever (without taking a fever reducer) and with improving of symptoms.  Please continue good preventive care measures, including:  frequent hand-washing, avoid touching your face, cover coughs/sneezes, stay out of crowds and keep a 6 foot distance from others.  Recheck or go to the nearest hospital ED tent for re-assessment if fever/cough/breathlessness return. he following symptoms may appear 2-14 days after exposure: . Fever .  Cough . Shortness of breath or difficulty breathing . Chills . Repeated shaking with chills . Muscle pain . Headache . Sore throat . New loss of taste or smell . Fatigue . Congestion or runny nose . Nausea or vomiting . Diarrhea  Go to the nearest hospital ED for assessment if fever/cough/breathlessness are severe or illness seems like a threat to life.  It is vitally important that if you feel that you have an infection such as this virus or any other virus that you stay home and away from places where you may spread it to others.  You should avoid contact with people age 27  and older.   Reduce your risk of any infection by using the same precautions used for avoiding the common cold or flu:  Marland Kitchen Wash your hands often with soap and warm water for at least 20 seconds.  If soap and water are not readily available, use an alcohol-based hand sanitizer with at least 60% alcohol.  . If coughing or sneezing, cover your mouth and nose by coughing or sneezing into the elbow areas of your shirt or coat, into a tissue or into your sleeve (not your hands). . Avoid shaking hands with others and consider head nods or verbal greetings only. . Avoid touching your eyes, nose, or mouth with unwashed hands.  . Avoid close contact with people who are sick. . Avoid places or events with large numbers of people in one location, like concerts or sporting events. . Carefully consider travel plans you have or are making. . If you are planning any travel outside or inside the [place], visit the CDC's Travelers' Health webpage for the latest health notices. . If you have some symptoms but not all symptoms, continue to monitor at home and seek medical attention if your symptoms worsen. . If you are having a medical emergency, call 911.  HOME CARE . Only take medications as instructed by your medical team. . Drink plenty of fluids and get plenty of rest. . A steam or ultrasonic humidifier can help if you have congestion.   GET HELP RIGHT AWAY IF YOU HAVE EMERGENCY WARNING SIGNS** FOR COVID-19. If you or someone is showing any of these signs seek emergency medical care immediately. Call 911 or proceed to your closest emergency facility if: . You develop worsening high fever. . Trouble breathing . Bluish lips or face . Persistent pain or pressure in the chest . New confusion . Inability to wake or stay awake . You cough up blood. . Your symptoms become more severe  **This list is not all possible symptoms. Contact your medical provider for any symptoms that are sever or concerning to  you.  MAKE SURE YOU   Understand these instructions.  Will watch your condition.  Will get help right away if you are not doing well or get worse.  Your e-visit answers were reviewed by a board certified advanced clinical practitioner to complete your personal care plan.  Depending on the condition, your plan could have included both over the counter or prescription medications.  If there is a problem please reply once you have received a response from your provider.  Your safety is important to [place].  If you have drug allergies check your prescription carefully.    You can use MyChart to ask questions about today's visit, request a non-urgent call back, or ask for a work or school excuse for 24 hours related to this e-Visit. If it has been greater than  24 hours you will need to follow up with your provider, or enter a new e-Visit to address those concerns. You will get an e-mail in the next two days asking about your experience.  I hope that your e-visit has been valuable and will speed your recovery. Thank you for using e-visits.      Greater than 5 minutes, yet less than 10 minutes of time have been spent researching, coordinating and implementing care for this patient today.

## 2019-08-13 ENCOUNTER — Encounter (INDEPENDENT_AMBULATORY_CARE_PROVIDER_SITE_OTHER): Payer: Self-pay

## 2019-08-14 ENCOUNTER — Emergency Department (HOSPITAL_BASED_OUTPATIENT_CLINIC_OR_DEPARTMENT_OTHER): Payer: Medicaid Other

## 2019-08-14 ENCOUNTER — Telehealth: Payer: Self-pay

## 2019-08-14 ENCOUNTER — Encounter (INDEPENDENT_AMBULATORY_CARE_PROVIDER_SITE_OTHER): Payer: Self-pay

## 2019-08-14 ENCOUNTER — Other Ambulatory Visit: Payer: Self-pay

## 2019-08-14 ENCOUNTER — Encounter (HOSPITAL_BASED_OUTPATIENT_CLINIC_OR_DEPARTMENT_OTHER): Payer: Self-pay | Admitting: Emergency Medicine

## 2019-08-14 ENCOUNTER — Emergency Department (HOSPITAL_BASED_OUTPATIENT_CLINIC_OR_DEPARTMENT_OTHER)
Admission: EM | Admit: 2019-08-14 | Discharge: 2019-08-14 | Disposition: A | Discharge status: Home / Self Care | Payer: Medicaid Other | Attending: Emergency Medicine | Admitting: Emergency Medicine

## 2019-08-14 DIAGNOSIS — R0789 Other chest pain: Secondary | ICD-10-CM | POA: Diagnosis present

## 2019-08-14 DIAGNOSIS — Z87891 Personal history of nicotine dependence: Secondary | ICD-10-CM | POA: Diagnosis not present

## 2019-08-14 DIAGNOSIS — Z79899 Other long term (current) drug therapy: Secondary | ICD-10-CM | POA: Diagnosis not present

## 2019-08-14 DIAGNOSIS — R0602 Shortness of breath: Secondary | ICD-10-CM | POA: Insufficient documentation

## 2019-08-14 DIAGNOSIS — M79604 Pain in right leg: Secondary | ICD-10-CM | POA: Insufficient documentation

## 2019-08-14 DIAGNOSIS — Z8616 Personal history of COVID-19: Secondary | ICD-10-CM | POA: Insufficient documentation

## 2019-08-14 HISTORY — DX: COVID-19: U07.1

## 2019-08-14 LAB — CBC WITH DIFFERENTIAL/PLATELET
Abs Immature Granulocytes: 0.01 10*3/uL (ref 0.00–0.07)
Basophils Absolute: 0 10*3/uL (ref 0.0–0.1)
Basophils Relative: 0 %
Eosinophils Absolute: 0.1 10*3/uL (ref 0.0–0.5)
Eosinophils Relative: 1 %
HCT: 40.6 % (ref 36.0–46.0)
Hemoglobin: 14.1 g/dL (ref 12.0–15.0)
Immature Granulocytes: 0 %
Lymphocytes Relative: 30 %
Lymphs Abs: 1.2 10*3/uL (ref 0.7–4.0)
MCH: 29.7 pg (ref 26.0–34.0)
MCHC: 34.7 g/dL (ref 30.0–36.0)
MCV: 85.7 fL (ref 80.0–100.0)
Monocytes Absolute: 0.3 10*3/uL (ref 0.1–1.0)
Monocytes Relative: 7 %
Neutro Abs: 2.4 10*3/uL (ref 1.7–7.7)
Neutrophils Relative %: 62 %
Platelets: 217 10*3/uL (ref 150–400)
RBC: 4.74 MIL/uL (ref 3.87–5.11)
RDW: 13.1 % (ref 11.5–15.5)
WBC: 3.9 10*3/uL — ABNORMAL LOW (ref 4.0–10.5)
nRBC: 0 % (ref 0.0–0.2)

## 2019-08-14 LAB — COMPREHENSIVE METABOLIC PANEL
ALT: 21 U/L (ref 0–44)
AST: 19 U/L (ref 15–41)
Albumin: 4.2 g/dL (ref 3.5–5.0)
Alkaline Phosphatase: 55 U/L (ref 38–126)
Anion gap: 8 (ref 5–15)
BUN: 8 mg/dL (ref 6–20)
CO2: 24 mmol/L (ref 22–32)
Calcium: 9.1 mg/dL (ref 8.9–10.3)
Chloride: 105 mmol/L (ref 98–111)
Creatinine, Ser: 0.64 mg/dL (ref 0.44–1.00)
GFR calc Af Amer: >60 mL/min (ref >60)
GFR calc non Af Amer: >60 mL/min (ref >60)
Glucose, Bld: 97 mg/dL (ref 70–99)
Potassium: 3.5 mmol/L (ref 3.5–5.1)
Sodium: 137 mmol/L (ref 135–145)
Total Bilirubin: 0.5 mg/dL (ref 0.3–1.2)
Total Protein: 7.3 g/dL (ref 6.5–8.1)

## 2019-08-14 LAB — TROPONIN I (HIGH SENSITIVITY): Troponin I (High Sensitivity): <2 ng/L (ref <18)

## 2019-08-14 LAB — PREGNANCY, URINE: Preg Test, Ur: NEGATIVE

## 2019-08-14 LAB — D-DIMER, QUANTITATIVE: D-Dimer, Quant: 0.42 ug/mL-FEU (ref 0.00–0.50)

## 2019-08-14 NOTE — Discharge Instructions (Addendum)
Please follow-up with your OB/GYN to discuss other birth control options. Please try to stop smoking.

## 2019-08-14 NOTE — ED Triage Notes (Signed)
Tested positive for Covid 5 days ago.  Right thigh pain yesterday.  Right calf pain started at 6am.  The pain stopped at 6:30 and she became SOB and started having CP.

## 2019-08-14 NOTE — ED Provider Notes (Signed)
[facility] EMERGENCY DEPARTMENT Provider Note   CSN: [id number] Arrival date & time: 08/14/19  0731     History Chief Complaint  Patient presents with  . Chest Pain    Catalyna Reilly is a 29 y.o. female.  The history is provided by the patient. No language interpreter was used.  Chest Pain  Jaeleigh Monaco is a 29 y.o. female who presents to the Emergency Department complaining of chest pain and leg pain. Last night she developed pain and a sensation of swelling in her right thigh. The pain then was in her right calf and leg this morning. Overall the pain in her leg has resolved but as soon as the pain resolved she developed shortness of breath and chest tightness. She feels like she cannot take a deep breath. She was diagnosed with COVID-19 on January 2. She became symptomatic on December 31. Overall her symptoms of COVID-19 have resolved. She initially experienced fever, cough, headache, diarrhea, loss of taste/smell and nausea. She has no medical problems. She does smoke tobacco and uses birth control. No history of DVT/PE.    Past Medical History:  Diagnosis Date  . Anemia   . COVID-19   . Depression   . Medical history non-contributory   . SVT (supraventricular tachycardia) (HCC) 2017    Patient Active Problem List   Diagnosis Date Noted  . Family history of Lynch syndrome 12/02/2018  . 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 ABLATION     SVT  . CHOLECYSTECTOMY  2017     OB History    Gravida  3   Para  2   Term  2   Preterm      AB  1   Living  2     SAB  1   TAB      Ectopic      Multiple  0   Live Births  2           No family history on file.  Social History   Tobacco Use  . Smoking status: Former Games developer  . Smokeless tobacco: Current User  Substance Use Topics  . Alcohol use: Not Currently  . Drug use: Not Currently    Types: Marijuana    Comment: not in past 5 years     Home Medications Prior to Admission medications   Medication Sig Start Date End Date Taking? Authorizing Provider  benzonatate (TESSALON) 100 MG capsule Take 1-2 capsules (100-200 mg total) by mouth 3 (three) times daily as needed for cough. 08/11/19   McVey, Madelaine Bhat, PA-C  calcium carbonate (TUMS - DOSED IN MG ELEMENTAL CALCIUM) 500 MG chewable tablet Chew 2 tablets by mouth as needed for indigestion or heartburn.    [provider]  ibuprofen (ADVIL) 600 MG tablet Take 1 tablet (600 mg total) by mouth every 6 (six) hours. Patient not taking: Reported on 05/27/2019 05/03/19   Towanda Octave, MD  norelgestromin-ethinyl estradiol (ORTHO EVRA) 150-35 MCG/24HR transdermal patch Place 1 patch onto the skin once a week. 05/27/19   Reva Bores, MD  Prenatal Vit-Fe Fumarate-FA (MULTIVITAMIN-PRENATAL) 27-0.8 MG TABS tablet Take 1 tablet by mouth daily at 12 noon.    [provider]  promethazine-dextromethorphan (PROMETHAZINE-DM) 6.25-15 MG/5ML syrup Take 5 mLs by mouth 4 (four) times daily as needed. 08/11/19   McVey, Madelaine Bhat, PA-C    Allergies    Patient has no known allergies.  Review  of Systems   Review of Systems  Cardiovascular: Positive for chest pain.  All other systems reviewed and are negative.   Physical Exam Updated Vital Signs BP (!) 124/92 (BP Location: Right Arm)   Pulse 95   Temp 98.1 F (36.7 C) (Oral)   Resp 20   Ht 5\' 7"  (1.702 m)   Wt 61.6 kg   LMP 07/24/2019   SpO2 100%   BMI 21.25 kg/m   Physical Exam Vitals and nursing note reviewed.  Constitutional:      Appearance: She is well-developed.  HENT:     Head: Normocephalic and atraumatic.  Cardiovascular:     Rate and Rhythm: Normal rate and regular rhythm.     Heart sounds: No murmur.  Pulmonary:     Effort: Pulmonary effort is normal. No respiratory distress.     Breath sounds: Normal breath sounds.  Abdominal:     Palpations: Abdomen is soft.     Tenderness:  There is no abdominal tenderness. There is no guarding or rebound.  Musculoskeletal:        General: No swelling or tenderness.  Skin:    General: Skin is warm and dry.  Neurological:     Mental Status: She is alert and oriented to person, place, and time.  Psychiatric:        Behavior: Behavior normal.     ED Results / Procedures / Treatments   Labs (all labs ordered are listed, but only abnormal results are displayed) Labs Reviewed  CBC WITH DIFFERENTIAL/PLATELET - Abnormal; Notable for the following components:      Result Value   WBC 3.9 (*)    All other components within normal limits  COMPREHENSIVE METABOLIC PANEL  D-DIMER, QUANTITATIVE (NOT AT [facility])  PREGNANCY, URINE  TROPONIN I (HIGH SENSITIVITY)    EKG EKG Interpretation  Date/Time:  Thursday August 14 2019 07:47:39 EST Ventricular Rate:  87 PR Interval:    QRS Duration: 93 QT Interval:  344 QTC Calculation: 414 R Axis:   88 Text Interpretation: Sinus rhythm no prior available for comparison Confirmed by [id number] [phone]) on 08/14/2019 7:52:54 AM   Radiology 10/12/2019 Venous Img Lower Unilateral Right  Result Date: 08/14/2019 CLINICAL DATA:  29 year old female with a history lower extremity swelling EXAM: RIGHT LOWER EXTREMITY VENOUS DOPPLER ULTRASOUND TECHNIQUE: Gray-scale sonography with graded compression, as well as color Doppler and duplex ultrasound were performed to evaluate the lower extremity deep venous systems from the level of the common femoral vein and including the common femoral, femoral, profunda femoral, popliteal and calf veins including the posterior tibial, peroneal and gastrocnemius veins when visible. The superficial great saphenous vein was also interrogated. Spectral Doppler was utilized to evaluate flow at rest and with distal augmentation maneuvers in the common femoral, femoral and popliteal veins. COMPARISON:  None. FINDINGS: Contralateral Common Femoral Vein: Respiratory phasicity is  normal and symmetric with the symptomatic side. No evidence of thrombus. Normal compressibility. Common Femoral Vein: No evidence of thrombus. Normal compressibility, respiratory phasicity and response to augmentation. Saphenofemoral Junction: No evidence of thrombus. Normal compressibility and flow on color Doppler imaging. Profunda Femoral Vein: No evidence of thrombus. Normal compressibility and flow on color Doppler imaging. Femoral Vein: No evidence of thrombus. Normal compressibility, respiratory phasicity and response to augmentation. Popliteal Vein: No evidence of thrombus. Normal compressibility, respiratory phasicity and response to augmentation. Calf Veins: No evidence of thrombus. Normal compressibility and flow on color Doppler imaging. Superficial Great Saphenous Vein: No evidence of thrombus.  Normal compressibility and flow on color Doppler imaging. Other Findings:  None. IMPRESSION: Sonographic survey of the right lower extremity negative for DVT Electronically Signed   By: Corrie Mckusick D.O.   On: 08/14/2019 09:32   DG Chest Port 1 View  Result Date: 08/14/2019 CLINICAL DATA:  Chest pain and shortness of breath. Recent COVID-19 positive EXAM: PORTABLE CHEST 1 VIEW COMPARISON:  None. FINDINGS: Lungs are clear. Heart size and pulmonary vascularity are normal. No adenopathy. No bone lesions. No pneumothorax. IMPRESSION: No abnormality noted. Electronically Signed   By: Lowella Grip III M.D.   On: 08/14/2019 09:44    Procedures Procedures (including critical care time)  Medications Ordered in ED Medications - No data to display  ED Course  I have reviewed the triage vital signs and the nursing notes.  Pertinent labs & imaging results that were available during my care of the patient were reviewed by me and considered in my medical decision making (see chart for details).    MDM Rules/Calculators/A&P                     Patient with history of recent COVID-19 infection here for  evaluation of right leg pain as well as chest discomfort. She is non-toxic appearing on evaluation with no respiratory distress. Vascular ultrasound is negative for DVT of the right lower extremity. No evidence of cellulitis. Chest x-ray negative for pneumonia, pneumothorax. Presentation is not consistent with ACS. Feel PE is unlikely despite her risk factors given her low D dimer. Discussed with patient home care for leg pain as well as atypical chest pain in the setting of COVID-19 infection. Discussed with patient tobacco cessation. Also discussed following up with her OB/GYN regarding change in her birth control if she is unable to discontinue tobacco use.  Vadie Principato was evaluated in Emergency Department on 08/14/2019 for the symptoms described in the history of present illness. She was evaluated in the context of the global COVID-19 pandemic, which necessitated consideration that the patient might be at risk for infection with the SARS-CoV-2 virus that causes COVID-19. Institutional protocols and algorithms that pertain to the evaluation of patients at risk for COVID-19 are in a state of rapid change based on information released by regulatory bodies including the CDC and federal and state organizations. These policies and algorithms were followed during the patient's care in the ED.   Final Clinical Impression(s) / ED Diagnoses Final diagnoses:  Atypical chest pain  Right leg pain    Rx / DC Orders ED Discharge Orders    None       Quintella Reichert, MD 08/14/19 1008

## 2019-08-14 NOTE — Telephone Encounter (Signed)
Patient advise on sob and diarrhea per protocol:   Shortness of breath is the same: continue to monitor at home   If symptoms become severe, i.e. shortness of breath at rest, gasping for air, wheezing, CALL 911 AND SEEK TREATMENT IN THE ED   If diarrhea remains the same: encourage patient to drink oral fluids and bland foods.   Avoid alcohol, spicy foods, caffeine or fatty foods that could make diarrhea worse.   Continue to monitor for signs of dehydration (increased thirst decreased urine output, yellow urine, dry skin, headache or dizziness).   Advise patient to try OTC medication (Imodium, kaopectate, Pepto-Bismol) as per manufacturer's instructions.    If worsening diarrhea occurs and becomes severe (6-7 bowel movements a day): notify PCP   If diarrhea last greater than 7 days: notify PCP   IF SIGNS OF DEHYDRATION OCCUR (INCREASED THIRST, DECREASED URINE OUTPUT, YELLOW URINE, DRY SKIN, HEADACHE OR DIZZINESS) ADVISE PATIENT TO CALL 911 AND SEEK TREATMENT IN THE ED  Patient states that she went to ED this morning for symptoms and was advise to continue to monitor her symptoms

## 2019-08-15 ENCOUNTER — Encounter (INDEPENDENT_AMBULATORY_CARE_PROVIDER_SITE_OTHER): Payer: Self-pay

## 2019-08-15 ENCOUNTER — Telehealth: Payer: Self-pay | Admitting: *Deleted

## 2019-08-15 NOTE — Telephone Encounter (Signed)
Received [facility] MyChart 08/15/19 due to worsening weakness; contacted the pt to discuss symptoms; she says her weakness is due to diarrhea; the pt says she had 3 episodes today; recommendations given to pt: Drink more fluids, at least 8-10 cups daily. One cup equals 8 oz (240 ml).  * WATER: Even for severe diarrhea, water is often the best liquid to drink. You should also eat some salty foods (e.g., potato chips, pretzels, saltine crackers). This is important to make sure you are getting enough salt, sugars, and fluids to meet your body's needs.  * SPORTS DRINKS: You can also drink a sports drink (e.g., Gatorade, Powerade) to help treat and prevent dehydration. For it to work best, mix it half and half with water.  * Avoid caffeinated beverages (Reason: caffeine is mildly dehydrating).  * Avoid alcohol beverages (beer, wine, hard liquor).    3: FOOD AND NUTRITION DURING SEVERE DIARRHEA:  * Drinking enough liquids is more important that eating when one has severe diarrhea.  * As the diarrhea starts to get better, you can slowly return to a normal diet.  * Begin with boiled starches / cereals (e.g., potatoes, rice, noodles, wheat, oats) with a small amount of salt to taste.  * Other foods that are OK include: bananas, yogurt, crackers, soup.   4: DIARRHEA MEDICINE - Loperamide (Imodium AD):  * This medicine helps decrease diarrhea. It is available over-the-counter (OTC) in a drug store.  * Adult dosage: 4 mg (2 capsules) is the recommended first dose. You may take an additional 2 mg (1 capsule) after each loose BM.  * Maximum dosage: 16 mg per day (8 capsules).  * Do not use for more than 2 days.   5: CAUTION - Loperamide (Imodium AD):  * DO NOT use if there is a fever over 100.4 F (38.0 C) or if there is blood or mucus in the stools.  * Read and follow the package instructions carefully.   6: DIARRHEA MEDICINE - Bismuth Subsalicylate (e.g., Kaopectate, Pepto-Bismol):  * This medicine can help  reduce diarrhea, vomiting, and abdominal cramping. It is available over-the-counter (OTC) in a drug store.  * Adult dosage: Take two tablets or two tablespoons by mouth every hour (if diarrhea continues) to a maximum of 8 doses in a 24 hour period.  * Do not use for more than 2 days.   7: CAUTION - Bismuth Subsalicylate (e.g., Kaopectate, Pepto-Bismol):  * May cause a temporary darkening of stool and tongue.  * Do not use if allergic to aspirin.  * Do not use in pregnancy.  * Read and follow the package instructions carefully.   8: CONTAGIOUSNESS:  * Be certain to wash your hands after using the restroom.  * If your work is cooking, Aeronautical engineer, serving or preparing food, then you should not work until the diarrhea has completely stopped.   9: CALL BACK IF:  * Signs of dehydration occur (e.g., no urine over 12 hours, very dry mouth, lightheaded, etc.)  * Bloody stools  * Constant or severe abdominal pain  * You become worse.   10: CARE ADVICE given per Diarrhea (Adult) guideline.  Pt also advised to call back if she has additional questions or concerns; she verbalized understanding, and would like for this information to be sent via MyChart; information sent per request.

## 2019-08-16 ENCOUNTER — Encounter (INDEPENDENT_AMBULATORY_CARE_PROVIDER_SITE_OTHER): Payer: Self-pay

## 2019-08-18 ENCOUNTER — Ambulatory Visit: Payer: Medicaid Other | Admitting: Obstetrics & Gynecology

## 2019-08-18 ENCOUNTER — Encounter (INDEPENDENT_AMBULATORY_CARE_PROVIDER_SITE_OTHER): Payer: Self-pay

## 2019-08-19 ENCOUNTER — Encounter (INDEPENDENT_AMBULATORY_CARE_PROVIDER_SITE_OTHER): Payer: Self-pay

## 2019-08-23 ENCOUNTER — Encounter (INDEPENDENT_AMBULATORY_CARE_PROVIDER_SITE_OTHER): Payer: Self-pay

## 2019-08-25 ENCOUNTER — Telehealth: Payer: Self-pay | Admitting: Radiology

## 2019-08-25 NOTE — Telephone Encounter (Signed)
Left message for patient to call [id number] to reschedule appointment, appointment cancelled per patient request

## 2019-08-27 ENCOUNTER — Ambulatory Visit: Payer: Medicaid Other | Admitting: Advanced Practice Midwife

## 2019-08-27 ENCOUNTER — Other Ambulatory Visit: Payer: Self-pay

## 2019-08-27 ENCOUNTER — Encounter: Payer: Self-pay | Admitting: Advanced Practice Midwife

## 2019-08-27 ENCOUNTER — Telehealth (INDEPENDENT_AMBULATORY_CARE_PROVIDER_SITE_OTHER): Payer: Medicaid Other | Admitting: Advanced Practice Midwife

## 2019-08-27 DIAGNOSIS — Z20822 Contact with and (suspected) exposure to covid-19: Secondary | ICD-10-CM

## 2019-08-27 DIAGNOSIS — N941 Unspecified dyspareunia: Secondary | ICD-10-CM

## 2019-08-27 DIAGNOSIS — R6882 Decreased libido: Secondary | ICD-10-CM

## 2019-08-27 DIAGNOSIS — U071 COVID-19: Secondary | ICD-10-CM

## 2019-08-27 NOTE — Progress Notes (Signed)
I connected with  Theresa Johnston on 08/27/19 at  2:00 PM EST by telephone and verified that I am speaking with the correct person using two identifiers.   I discussed the limitations, risks, security and privacy concerns of performing an evaluation and management service by telephone and the availability of in person appointments. I also discussed with the patient that there may be a patient responsible charge related to this service. The patient expressed understanding and agreed to proceed.  Scheryl Marten, RN 08/27/2019  1:58 PM

## 2019-08-27 NOTE — Progress Notes (Signed)
TELEHEALTH GYNECOLOGY VIRTUAL VIDEO VISIT ENCOUNTER NOTE  Provider location: [facility]   I connected with Theresa Johnston on 08/27/19 at  2:00 PM EST by MyChart Video Encounter at home and verified that I am speaking with the correct person using two identifiers.   I discussed the limitations, risks, security and privacy concerns of performing an evaluation and management service virtually and the availability of in person appointments. I also discussed with the patient that there may be a patient responsible charge related to this service. The patient expressed understanding and agreed to proceed.   History:  Theresa Johnston is a 29 y.o. [phone] female being evaluated today for multiple complaints. She endorses seven year history of dyspareunia and vaginal dryness. She has attempted management with changes in contraception, paced foreplay and OTC lubricant but has only experienced temporary relief of symptoms.. She denies any abnormal vaginal discharge, bleeding, pelvic pain or other concerns.       Past Medical History:  Diagnosis Date  . Anemia   . COVID-19   . Depression   . Medical history non-contributory   . SVT (supraventricular tachycardia) ([place]) 2017   Past Surgical History:  Procedure Laterality Date  . ABLATION     SVT  . CHOLECYSTECTOMY  2017   The following portions of the patient's history were reviewed and updated as appropriate: allergies, current medications, past family history, past medical history, past social history, past surgical history and problem list.   Health Maintenance:  Normal pap and negative HRHPV on 12/02/2018.  No mammogram history (age 32).  Review of Systems:  Pertinent items noted in HPI and remainder of comprehensive ROS otherwise negative.  Physical Exam:   General:  Alert, oriented and cooperative. Patient appears to be in no acute distress.  Mental Status: Normal mood and affect. Normal behavior.  Normal judgment and thought content.   Respiratory: Normal respiratory effort, no problems with respiration noted  Rest of physical exam deferred due to type of encounter  Labs and Imaging Results for orders placed or performed during the hospital encounter of 08/14/19 (from the past 336 hour(s))  Comprehensive metabolic panel   Collection Time: 08/14/19  8:07 AM  Result Value Ref Range   Sodium 137 135 - 145 mmol/L   Potassium 3.5 3.5 - 5.1 mmol/L   Chloride 105 98 - 111 mmol/L   CO2 24 22 - 32 mmol/L   Glucose, Bld 97 70 - 99 mg/dL   BUN 8 6 - 20 mg/dL   Creatinine, Ser 0.64 0.44 - 1.00 mg/dL   Calcium 9.1 8.9 - 10.3 mg/dL   Total Protein 7.3 6.5 - 8.1 g/dL   Albumin 4.2 3.5 - 5.0 g/dL   AST 19 15 - 41 U/L   ALT 21 0 - 44 U/L   Alkaline Phosphatase 55 38 - 126 U/L   Total Bilirubin 0.5 0.3 - 1.2 mg/dL   GFR calc non Af Amer >60 >60 mL/min   GFR calc Af Amer >60 >60 mL/min   Anion gap 8 5 - 15  CBC with Differential   Collection Time: 08/14/19  8:07 AM  Result Value Ref Range   WBC 3.9 (L) 4.0 - 10.5 K/uL   RBC 4.74 3.87 - 5.11 MIL/uL   Hemoglobin 14.1 12.0 - 15.0 g/dL   HCT 40.6 36.0 - 46.0 %   MCV 85.7 80.0 - 100.0 fL   MCH 29.7 26.0 - 34.0 pg   MCHC 34.7  30.0 - 36.0 g/dL   RDW 17.0 01.7 - 49.4 %   Platelets 217 150 - 400 K/uL   nRBC 0.0 0.0 - 0.2 %   Neutrophils Relative % 62 %   Neutro Abs 2.4 1.7 - 7.7 K/uL   Lymphocytes Relative 30 %   Lymphs Abs 1.2 0.7 - 4.0 K/uL   Monocytes Relative 7 %   Monocytes Absolute 0.3 0.1 - 1.0 K/uL   Eosinophils Relative 1 %   Eosinophils Absolute 0.1 0.0 - 0.5 K/uL   Basophils Relative 0 %   Basophils Absolute 0.0 0.0 - 0.1 K/uL   Immature Granulocytes 0 %   Abs Immature Granulocytes 0.01 0.00 - 0.07 K/uL  D-dimer, quantitative   Collection Time: 08/14/19  8:07 AM  Result Value Ref Range   D-Dimer, Quant 0.42 0.00 - 0.50 ug/mL-FEU  Troponin I (High Sensitivity)   Collection Time: 08/14/19  8:07 AM  Result Value Ref Range     Troponin I (High Sensitivity) <2 <18 ng/L  Pregnancy, urine   Collection Time: 08/14/19  9:55 AM  Result Value Ref Range   Preg Test, Ur NEGATIVE NEGATIVE   US Venous Img Lower Unilateral Right  Result Date: 08/14/2019 CLINICAL DATA:  29 year old female with a history lower extremity swelling EXAM: RIGHT LOWER EXTREMITY VENOUS DOPPLER ULTRASOUND TECHNIQUE: Gray-scale sonography with graded compression, as well as color Doppler and duplex ultrasound were performed to evaluate the lower extremity deep venous systems from the level of the common femoral vein and including the common femoral, femoral, profunda femoral, popliteal and calf veins including the posterior tibial, peroneal and gastrocnemius veins when visible. The superficial great saphenous vein was also interrogated. Spectral Doppler was utilized to evaluate flow at rest and with distal augmentation maneuvers in the common femoral, femoral and popliteal veins. COMPARISON:  None. FINDINGS: Contralateral Common Femoral Vein: Respiratory phasicity is normal and symmetric with the symptomatic side. No evidence of thrombus. Normal compressibility. Common Femoral Vein: No evidence of thrombus. Normal compressibility, respiratory phasicity and response to augmentation. Saphenofemoral Junction: No evidence of thrombus. Normal compressibility and flow on color Doppler imaging. Profunda Femoral Vein: No evidence of thrombus. Normal compressibility and flow on color Doppler imaging. Femoral Vein: No evidence of thrombus. Normal compressibility, respiratory phasicity and response to augmentation. Popliteal Vein: No evidence of thrombus. Normal compressibility, respiratory phasicity and response to augmentation. Calf Veins: No evidence of thrombus. Normal compressibility and flow on color Doppler imaging. Superficial Great Saphenous Vein: No evidence of thrombus. Normal compressibility and flow on color Doppler imaging. Other Findings:  None. IMPRESSION:  Sonographic survey of the right lower extremity negative for DVT Electronically Signed   By: Gilmer Mor D.O.   On: 08/14/2019 09:32   DG Chest Port 1 View  Result Date: 08/14/2019 CLINICAL DATA:  Chest pain and shortness of breath. Recent COVID-19 positive EXAM: PORTABLE CHEST 1 VIEW COMPARISON:  None. FINDINGS: Lungs are clear. Heart size and pulmonary vascularity are normal. No adenopathy. No bone lesions. No pneumothorax. IMPRESSION: No abnormality noted. Electronically Signed   By: Bretta Bang III M.D.   On: 08/14/2019 09:44       Assessment and Plan:     1. Decreased sex drive --Patient to present to clinic for in-person evalution  2. Dyspareunia in female - Discussed need for physical exam, possible incorporation of lab work, imaging,  Physical Therapy PRN  3. COVID-19 virus infection --Newborn daughter COVID positive --Family quarantine period ends tomorrow  I discussed the assessment and treatment plan with the patient. The patient was provided an opportunity to ask questions and all were answered. The patient agreed with the plan and demonstrated an understanding of the instructions.   The patient was advised to call back or seek an in-person evaluation/go to the ED if the symptoms worsen or if the condition fails to improve as anticipated.  I provided ten minutes of face-to-face time during this encounter.   Calvert Cantor, CNM [facility] [employer], [facility]

## 2019-09-06 ENCOUNTER — Emergency Department (HOSPITAL_COMMUNITY)
Admission: EM | Admit: 2019-09-06 | Discharge: 2019-09-06 | Disposition: A | Discharge status: Home / Self Care | Payer: Medicaid Other | Attending: Emergency Medicine | Admitting: Emergency Medicine

## 2019-09-06 ENCOUNTER — Emergency Department (HOSPITAL_COMMUNITY): Payer: Medicaid Other

## 2019-09-06 ENCOUNTER — Other Ambulatory Visit: Payer: Self-pay

## 2019-09-06 ENCOUNTER — Encounter (HOSPITAL_COMMUNITY): Payer: Self-pay | Admitting: Emergency Medicine

## 2019-09-06 DIAGNOSIS — R0602 Shortness of breath: Secondary | ICD-10-CM | POA: Insufficient documentation

## 2019-09-06 DIAGNOSIS — F17228 Nicotine dependence, chewing tobacco, with other nicotine-induced disorders: Secondary | ICD-10-CM | POA: Diagnosis not present

## 2019-09-06 DIAGNOSIS — Z79899 Other long term (current) drug therapy: Secondary | ICD-10-CM | POA: Insufficient documentation

## 2019-09-06 DIAGNOSIS — R42 Dizziness and giddiness: Secondary | ICD-10-CM | POA: Insufficient documentation

## 2019-09-06 LAB — CBC WITH DIFFERENTIAL/PLATELET
Abs Immature Granulocytes: 0.01 10*3/uL (ref 0.00–0.07)
Basophils Absolute: 0 10*3/uL (ref 0.0–0.1)
Basophils Relative: 0 %
Eosinophils Absolute: 0 10*3/uL (ref 0.0–0.5)
Eosinophils Relative: 0 %
HCT: 38.9 % (ref 36.0–46.0)
Hemoglobin: 13.5 g/dL (ref 12.0–15.0)
Immature Granulocytes: 0 %
Lymphocytes Relative: 24 %
Lymphs Abs: 1.4 10*3/uL (ref 0.7–4.0)
MCH: 30.2 pg (ref 26.0–34.0)
MCHC: 34.7 g/dL (ref 30.0–36.0)
MCV: 87 fL (ref 80.0–100.0)
Monocytes Absolute: 0.2 10*3/uL (ref 0.1–1.0)
Monocytes Relative: 4 %
Neutro Abs: 4.3 10*3/uL (ref 1.7–7.7)
Neutrophils Relative %: 72 %
Platelets: 228 10*3/uL (ref 150–400)
RBC: 4.47 MIL/uL (ref 3.87–5.11)
RDW: 12.5 % (ref 11.5–15.5)
WBC: 5.9 10*3/uL (ref 4.0–10.5)
nRBC: 0 % (ref 0.0–0.2)

## 2019-09-06 LAB — BASIC METABOLIC PANEL
Anion gap: 12 (ref 5–15)
BUN: 7 mg/dL (ref 6–20)
CO2: 21 mmol/L — ABNORMAL LOW (ref 22–32)
Calcium: 9.4 mg/dL (ref 8.9–10.3)
Chloride: 105 mmol/L (ref 98–111)
Creatinine, Ser: 0.69 mg/dL (ref 0.44–1.00)
GFR calc Af Amer: >60 mL/min (ref >60)
GFR calc non Af Amer: >60 mL/min (ref >60)
Glucose, Bld: 105 mg/dL — ABNORMAL HIGH (ref 70–99)
Potassium: 3.6 mmol/L (ref 3.5–5.1)
Sodium: 138 mmol/L (ref 135–145)

## 2019-09-06 LAB — TROPONIN I (HIGH SENSITIVITY)
Troponin I (High Sensitivity): <2 ng/L (ref <18)
Troponin I (High Sensitivity): <2 ng/L (ref <18)

## 2019-09-06 LAB — I-STAT BETA HCG BLOOD, ED (MC, WL, AP ONLY): I-stat hCG, quantitative: <5 m[IU]/mL (ref <5)

## 2019-09-06 LAB — D-DIMER, QUANTITATIVE: D-Dimer, Quant: 0.61 ug/mL-FEU — ABNORMAL HIGH (ref 0.00–0.50)

## 2019-09-06 MED ORDER — IOHEXOL 350 MG/ML SOLN
100.0000 mL | Freq: Once | INTRAVENOUS | Status: AC | PRN
  Administered 2019-09-06: 61 mL via INTRAVENOUS

## 2019-09-06 MED ORDER — SODIUM CHLORIDE 0.9% FLUSH: 3.0000 mL | Freq: Once | INTRAVENOUS | Status: DC

## 2019-09-06 NOTE — ED Notes (Signed)
Patient Alert and oriented to baseline. Stable and ambulatory to baseline. Patient verbalized understanding of the discharge instructions.  Patient belongings were taken by the patient.   

## 2019-09-06 NOTE — ED Triage Notes (Addendum)
C/o SOB since 3am.  States she was seen at an urgent care this morning and had abnormal EKG.  Also reports feeling lightheaded.  Denies pain.  COVID + 1/2.

## 2019-09-06 NOTE — Discharge Instructions (Addendum)
You were seen in the emergency department today for shortness of breath, lightheadedness & chest discomfort. Your work-up in the emergency department has been overall reassuring. Your labs have been fairly normal and or similar to previous blood work you have had done. Your EKG and the enzyme we use to check your heart did not show an acute heart attack at this time. Your chest x-ray was normal. Your CT scan was normal and did not show a blood clot.   Please try to rest and remain well hydrated.   We would like you to follow up closely with your primary care provider within 1-3 days. Return to the ER immediately should you experience any new or worsening symptoms including but not limited to return of pain, worsened pain, vomiting, shortness of breath, dizziness, lightheadedness, passing out, or any other concerns that you may have.

## 2019-09-06 NOTE — ED Notes (Signed)
Pt dx with COVID 1/2-- is also 4 months postpartum-- episode of shortness of breath woke pt up at 3am,  Short of breath with exertion but able to talk in sentences.

## 2019-09-06 NOTE — ED Provider Notes (Signed)
15:15: Assumed care of patient from Fayrene Helper PA-C at change of shift pending CTA & repeat troponin.   Please see prior provider not for full H&P. Briefly patient is a 29 yo female who was recently diagnosed with COVID 19 about 1 month ago with recovery who presented to the ED with complaints of dyspnea today with associated mild chest tightness & lightheadedness.   Diagnosed with COVID 19 1 month prior, majority of sxs resolved, currently here with dyspnea & mild chest discomfort that began this AM. Mild lightheadedness   Physical Exam  BP 119/80 (BP Location: Left Arm)   Pulse (!) 106   Temp 98.1 F (36.7 C) (Oral)   Resp 18   Ht 5\' 7"  (1.702 m)   Wt 61.5 kg   LMP 08/23/2019   SpO2 99%   BMI 21.24 kg/m   Physical Exam Vitals and nursing note reviewed.  Constitutional:      General: She is not in acute distress.    Appearance: She is well-developed.  HENT:     Head: Normocephalic and atraumatic.  Eyes:     General:        Right eye: No discharge.        Left eye: No discharge.     Conjunctiva/sclera: Conjunctivae normal.  Pulmonary:     Effort: Pulmonary effort is normal. No respiratory distress.  Neurological:     Mental Status: She is alert.     Comments: Clear speech.   Psychiatric:        Behavior: Behavior normal.        Thought Content: Thought content normal.     ED Course/Procedures     Results for orders placed or performed during the hospital encounter of 09/06/19  Basic metabolic panel  Result Value Ref Range   Sodium 138 135 - 145 mmol/L   Potassium 3.6 3.5 - 5.1 mmol/L   Chloride 105 98 - 111 mmol/L   CO2 21 (L) 22 - 32 mmol/L   Glucose, Bld 105 (H) 70 - 99 mg/dL   BUN 7 6 - 20 mg/dL   Creatinine, Ser 09/08/19 0.44 - 1.00 mg/dL   Calcium 9.4 8.9 - 2.35 mg/dL   GFR calc non Af Amer >60 >60 mL/min   GFR calc Af Amer >60 >60 mL/min   Anion gap 12 5 - 15  CBC with Differential  Result Value Ref Range   WBC 5.9 4.0 - 10.5 K/uL   RBC 4.47 3.87 - 5.11  MIL/uL   Hemoglobin 13.5 12.0 - 15.0 g/dL   HCT 36.1 44.3 - 15.4 %   MCV 87.0 80.0 - 100.0 fL   MCH 30.2 26.0 - 34.0 pg   MCHC 34.7 30.0 - 36.0 g/dL   RDW 00.8 67.6 - 19.5 %   Platelets 228 150 - 400 K/uL   nRBC 0.0 0.0 - 0.2 %   Neutrophils Relative % 72 %   Neutro Abs 4.3 1.7 - 7.7 K/uL   Lymphocytes Relative 24 %   Lymphs Abs 1.4 0.7 - 4.0 K/uL   Monocytes Relative 4 %   Monocytes Absolute 0.2 0.1 - 1.0 K/uL   Eosinophils Relative 0 %   Eosinophils Absolute 0.0 0.0 - 0.5 K/uL   Basophils Relative 0 %   Basophils Absolute 0.0 0.0 - 0.1 K/uL   Immature Granulocytes 0 %   Abs Immature Granulocytes 0.01 0.00 - 0.07 K/uL  D-dimer, quantitative  Result Value Ref Range   D-Dimer, Quant 0.61 (H)  0.00 - 0.50 ug/mL-FEU  I-Stat beta hCG blood, ED  Result Value Ref Range   I-stat hCG, quantitative <5.0 <5 mIU/mL   Comment 3          Troponin I (High Sensitivity)  Result Value Ref Range   Troponin I (High Sensitivity) <2 <18 ng/L  Troponin I (High Sensitivity)  Result Value Ref Range   Troponin I (High Sensitivity) <2 <18 ng/L   DG Chest 2 View  Result Date: 09/06/2019 CLINICAL DATA:  Shortness of breath since this morning. COVID-19 positive 08/09/2019. EXAM: CHEST - 2 VIEW COMPARISON:  None. FINDINGS: The heart size and mediastinal contours are within normal limits. Both lungs are clear. The visualized skeletal structures are unremarkable. IMPRESSION: No active cardiopulmonary disease. Electronically Signed   By: Marin Olp M.D.   On: 09/06/2019 13:01   CT Angio Chest PE W and/or Wo Contrast  Result Date: 09/06/2019 CLINICAL DATA:  Shortness of breath. EXAM: CT ANGIOGRAPHY CHEST WITH CONTRAST TECHNIQUE: Multidetector CT imaging of the chest was performed using the standard protocol during bolus administration of intravenous contrast. Multiplanar CT image reconstructions and MIPs were obtained to evaluate the vascular anatomy. CONTRAST:  53mL OMNIPAQUE IOHEXOL 350 MG/ML SOLN  COMPARISON:  Chest x-ray September 06, 2019 FINDINGS: Cardiovascular: Satisfactory opacification of the pulmonary arteries to the segmental level. No evidence of pulmonary embolism. Normal heart size. No pericardial effusion. Mediastinum/Nodes: No enlarged mediastinal, hilar, or axillary lymph nodes. Thyroid gland, trachea, and esophagus demonstrate no significant findings. Lungs/Pleura: Lungs are clear. No pleural effusion or pneumothorax. Upper Abdomen: No acute abnormality. Musculoskeletal: No chest wall abnormality. No acute or significant osseous findings. Review of the MIP images confirms the above findings. IMPRESSION: Normal study.  No pulmonary emboli identified. Electronically Signed   By: Dorise Bullion III M.D   On: 09/06/2019 16:20   US Venous Img Lower Unilateral Right  Result Date: 08/14/2019 CLINICAL DATA:  29 year old female with a history lower extremity swelling EXAM: RIGHT LOWER EXTREMITY VENOUS DOPPLER ULTRASOUND TECHNIQUE: Gray-scale sonography with graded compression, as well as color Doppler and duplex ultrasound were performed to evaluate the lower extremity deep venous systems from the level of the common femoral vein and including the common femoral, femoral, profunda femoral, popliteal and calf veins including the posterior tibial, peroneal and gastrocnemius veins when visible. The superficial great saphenous vein was also interrogated. Spectral Doppler was utilized to evaluate flow at rest and with distal augmentation maneuvers in the common femoral, femoral and popliteal veins. COMPARISON:  None. FINDINGS: Contralateral Common Femoral Vein: Respiratory phasicity is normal and symmetric with the symptomatic side. No evidence of thrombus. Normal compressibility. Common Femoral Vein: No evidence of thrombus. Normal compressibility, respiratory phasicity and response to augmentation. Saphenofemoral Junction: No evidence of thrombus. Normal compressibility and flow on color Doppler  imaging. Profunda Femoral Vein: No evidence of thrombus. Normal compressibility and flow on color Doppler imaging. Femoral Vein: No evidence of thrombus. Normal compressibility, respiratory phasicity and response to augmentation. Popliteal Vein: No evidence of thrombus. Normal compressibility, respiratory phasicity and response to augmentation. Calf Veins: No evidence of thrombus. Normal compressibility and flow on color Doppler imaging. Superficial Great Saphenous Vein: No evidence of thrombus. Normal compressibility and flow on color Doppler imaging. Other Findings:  None. IMPRESSION: Sonographic survey of the right lower extremity negative for DVT Electronically Signed   By: Corrie Mckusick D.O.   On: 08/14/2019 09:32   DG Chest Port 1 View  Result Date: 08/14/2019 CLINICAL DATA:  Chest  pain and shortness of breath. Recent COVID-19 positive EXAM: PORTABLE CHEST 1 VIEW COMPARISON:  None. FINDINGS: Lungs are clear. Heart size and pulmonary vascularity are normal. No adenopathy. No bone lesions. No pneumothorax. IMPRESSION: No abnormality noted. Electronically Signed   By: Bretta Bang III M.D.   On: 08/14/2019 09:44   EKG Interpretation  Date/Time:  Saturday September 06 2019 12:29:24 EST Ventricular Rate:  108 PR Interval:  176 QRS Duration: 88 QT Interval:  312 QTC Calculation: 418 R Axis:   97 Text Interpretation: Sinus tachycardia Right atrial enlargement Rightward axis Pulmonary disease pattern T wave abnormality, consider inferior ischemia T wave abnormality, consider anterolateral ischemia Abnormal ECG agree, inferior t wave inversion compared to previous Confirmed by Arby Barrette [id number]) on 09/06/2019 2:13:27 PM  Procedures  MDM   Plan @ change of shift is to follow up on CTA & troponin, discharge home if not acute significant abnormalities.   Labs overall reassuring Troponins are flat- doubt ACS CTA negative for PE or other acute process.   Reassuring work-up. No signs of  respiratory distress. Patient appears appropriate for discharge.   I discussed results, treatment plan, need for follow-up, and return precautions with the patient. Provided opportunity for questions, patient confirmed understanding and is in agreement with plan.       Cherly Anderson, PA-C 09/06/19 [id number]    Little, Ambrose Finland, MD 09/07/19 [phone]

## 2019-09-06 NOTE — ED Provider Notes (Signed)
[facility] EMERGENCY DEPARTMENT Provider Note   CSN: [id number] Arrival date & time: 09/06/19  1218     History Chief Complaint  Patient presents with  . Shortness of Breath  . abnormal EKG    Theresa Johnston is a 29 y.o. female.  The history is provided by the patient. No language interpreter was used.  Shortness of Breath    29 year old female with history of SVT, depression, anemia, recently diagnosed with COVID-19 presenting complaining of shortness of breath.  Patient report this morning she woke up feeling a bit lightheadedness.  She went back to sleep and then when she wakes up again she endorsed a tightness sensation across the chest with some mild shortness of breath.  States that she feels like she cannot catch of breath or taking a full breath.  She did not report of any fever or chills no headache, dizziness, exertional chest pain, nauseous, vomiting, diarrhea, constipation, loss of taste or smell, back pain, abdominal pain.  Her last menstruation was 2 weeks ago.  She was tested positive for COVID-19 a month ago when she developed fever chills headache and diarrhea without any upper respiratory symptoms.  Most of those symptoms have since resolved.  She did report remote history of SVT status post ablation.  She is a smoker and vape.  She denies any prior history of PE or DVT.  Is currently on oral birth control pill.  No recent travel, recent surgery, prolonged bedrest, active cancer, calf pain or leg swelling.  Past Medical History:  Diagnosis Date  . Anemia   . COVID-19   . Depression   . Medical history non-contributory   . SVT (supraventricular tachycardia) ([place]) 2017    Patient Active Problem List   Diagnosis Date Noted  . Family history of Lynch syndrome 12/02/2018  . 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 ABLATION     SVT  . CHOLECYSTECTOMY  2017     OB History    Gravida  3   Para   2   Term  2   Preterm      AB  1   Living  2     SAB  1   TAB      Ectopic      Multiple  0   Live Births  2           No family history on file.  Social History   Tobacco Use  . Smoking status: Former Research scientist (life sciences)  . Smokeless tobacco: Current User  Substance Use Topics  . Alcohol use: Not Currently  . Drug use: Not Currently    Types: Marijuana    Comment: not in past 5 years    Home Medications Prior to Admission medications   Medication Sig Start Date End Date Taking? Authorizing Provider  benzonatate (TESSALON) 100 MG capsule Take 1-2 capsules (100-200 mg total) by mouth 3 (three) times daily as needed for cough. Patient not taking: Reported on 08/27/2019 08/11/19   McVey, Gelene Mink, PA-C  calcium carbonate (TUMS - DOSED IN MG ELEMENTAL CALCIUM) 500 MG chewable tablet Chew 2 tablets by mouth as needed for indigestion or heartburn.    [provider]  ibuprofen (ADVIL) 600 MG tablet Take 1 tablet (600 mg total) by mouth every 6 (six) hours. Patient not taking: Reported on 05/27/2019 05/03/19   Lattie Haw, MD  norelgestromin-ethinyl estradiol (ORTHO EVRA) 150-35 MCG/24HR transdermal patch Place  1 patch onto the skin once a week. 05/27/19   Reva Bores, MD  Prenatal Vit-Fe Fumarate-FA (MULTIVITAMIN-PRENATAL) 27-0.8 MG TABS tablet Take 1 tablet by mouth daily at 12 noon.    [provider]  promethazine-dextromethorphan (PROMETHAZINE-DM) 6.25-15 MG/5ML syrup Take 5 mLs by mouth 4 (four) times daily as needed. Patient not taking: Reported on 08/27/2019 08/11/19   McVey, Madelaine Bhat, PA-C    Allergies    Patient has no known allergies.  Review of Systems   Review of Systems  Respiratory: Positive for shortness of breath.   All other systems reviewed and are negative.   Physical Exam Updated Vital Signs BP 119/80 (BP Location: Left Arm)   Pulse (!) 106   Temp 98.1 F (36.7 C) (Oral)   Resp 18   LMP 08/23/2019   SpO2 99%    Physical Exam Vitals and nursing note reviewed.  Constitutional:      General: She is not in acute distress.    Appearance: She is well-developed.  HENT:     Head: Atraumatic.  Eyes:     Conjunctiva/sclera: Conjunctivae normal.  Cardiovascular:     Rate and Rhythm: Tachycardia present.  Pulmonary:     Effort: Pulmonary effort is normal.     Breath sounds: Normal breath sounds. No decreased breath sounds, wheezing, rhonchi or rales.  Chest:     Chest wall: No tenderness.  Abdominal:     Palpations: Abdomen is soft.     Tenderness: There is no abdominal tenderness.  Musculoskeletal:     Cervical back: Neck supple.     Right lower leg: No edema.     Left lower leg: No edema.  Skin:    Capillary Refill: Capillary refill takes less than 2 seconds.     Findings: No rash.  Neurological:     Mental Status: She is alert and oriented to person, place, and time.  Psychiatric:        Mood and Affect: Mood normal.     ED Results / Procedures / Treatments   Labs (all labs ordered are listed, but only abnormal results are displayed) Labs Reviewed  BASIC METABOLIC PANEL - Abnormal; Notable for the following components:      Result Value   CO2 21 (*)    Glucose, Bld 105 (*)    All other components within normal limits  D-DIMER, QUANTITATIVE (NOT AT [facility]) - Abnormal; Notable for the following components:   D-Dimer, Quant 0.61 (*)    All other components within normal limits  CBC WITH DIFFERENTIAL/PLATELET  I-STAT BETA HCG BLOOD, ED ([facility], [facility], [facility] ONLY)  TROPONIN I (HIGH SENSITIVITY)  TROPONIN I (HIGH SENSITIVITY)    EKG EKG Interpretation  Date/Time:  Saturday September 06 2019 12:29:24 EST Ventricular Rate:  108 PR Interval:  176 QRS Duration: 88 QT Interval:  312 QTC Calculation: 418 R Axis:   97 Text Interpretation: Sinus tachycardia Right atrial enlargement Rightward axis Pulmonary disease pattern T wave abnormality, consider inferior ischemia T wave abnormality,  consider anterolateral ischemia Abnormal ECG agree, inferior t wave inversion compared to previous Confirmed by Arby Barrette [phone]) on 09/06/2019 2:13:27 PM   Radiology DG Chest 2 View  Result Date: 09/06/2019 CLINICAL DATA:  Shortness of breath since this morning. COVID-19 positive 08/09/2019. EXAM: CHEST - 2 VIEW COMPARISON:  None. FINDINGS: The heart size and mediastinal contours are within normal limits. Both lungs are clear. The visualized skeletal structures are unremarkable. IMPRESSION: No active cardiopulmonary disease. Electronically Signed  By: Elberta Fortis M.D.   On: 09/06/2019 13:01    Procedures Procedures (including critical care time)  Medications Ordered in ED Medications  sodium chloride flush (NS) 0.9 % injection 3 mL (has no administration in time range)    ED Course  I have reviewed the triage vital signs and the nursing notes.  Pertinent labs & imaging results that were available during my care of the patient were reviewed by me and considered in my medical decision making (see chart for details).    MDM Rules/Calculators/A&P                      BP 119/80 (BP Location: Left Arm)   Pulse (!) 106   Temp 98.1 F (36.7 C) (Oral)   Resp 18   Ht 5\' 7"  (1.702 m)   Wt 61.5 kg   LMP 08/23/2019   SpO2 99%   BMI 21.24 kg/m   Final Clinical Impression(s) / ED Diagnoses Final diagnoses:  None    Rx / DC Orders ED Discharge Orders    None     1:16 PM Patient here with shortness of breath and lightheadedness that started this morning.  Was diagnosed with COVID-19 a month ago and most of those symptoms have since resolved.  No other complaint.  Patient initially went to urgent care center for her symptoms but was sent here due to "abnormal EKG".  Her EKG here shows sinus tachycardia with rightward axis pulmonary disease pattern as well as T wave changes in the inferior and anterior lateral leads.  She is mildly tachycardic with heart rate of 106 and is  currently on birth control pill therefore she cannot be ruled out for PE with PERC criteria.  Will obtain D-dimer, additional work-up added.  Patient otherwise in no acute discomfort.  3:11 PM Chest CT angiogram is currently pending.  Patient signed out to oncoming provider who will follow up on the CT results as well as Delta Troponin and reassess patient and will discuss discharge as appropriate.  Patient currently resting comfortably and is aware of plan.  Theresa Johnston was evaluated in Emergency Department on 09/06/2019 for the symptoms described in the history of present illness. She was evaluated in the context of the global COVID-19 pandemic, which necessitated consideration that the patient might be at risk for infection with the SARS-CoV-2 virus that causes COVID-19. Institutional protocols and algorithms that pertain to the evaluation of patients at risk for COVID-19 are in a state of rapid change based on information released by regulatory bodies including the CDC and federal and state organizations. These policies and algorithms were followed during the patient's care in the ED.    09/08/2019, PA-C 09/06/19 1512    09/08/19, MD 09/07/19 [phone]

## 2020-04-16 ENCOUNTER — Encounter: Payer: Self-pay | Admitting: Radiology

## 2020-05-04 ENCOUNTER — Telehealth: Payer: Self-pay | Admitting: Radiology

## 2020-05-04 NOTE — Telephone Encounter (Signed)
Called patient to correct address, received letter back that was sent to follow up with Dr Shawnie Pons.

## 2020-07-11 IMAGING — US US MFM OB DETAIL +14 WK
1 series · 13 of 28 positions shown · non-contrast
Comparison: none

[Series 1: us mfm ob detail +14 wk · 13 of 96 slices shown]
[im 4/96]
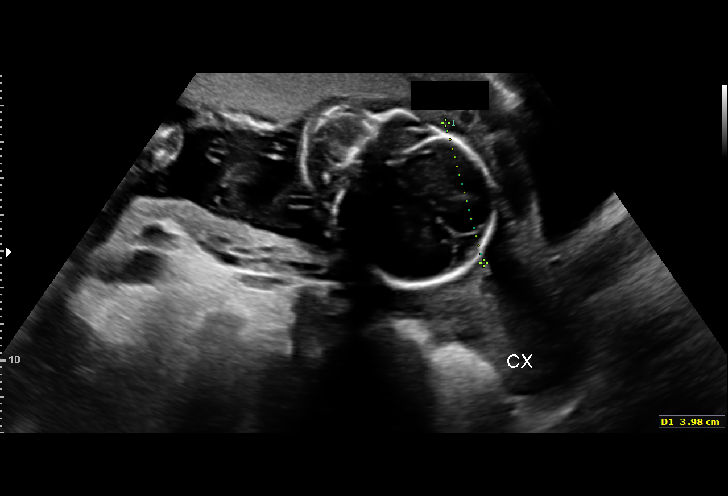
[im 11/96]
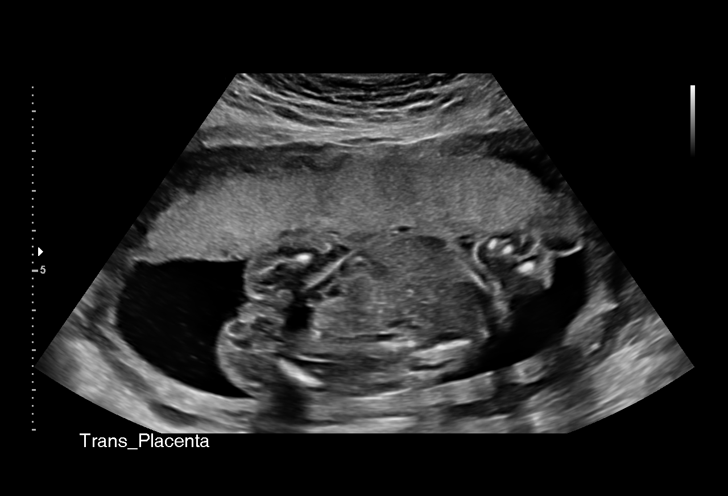
[im 18/96]
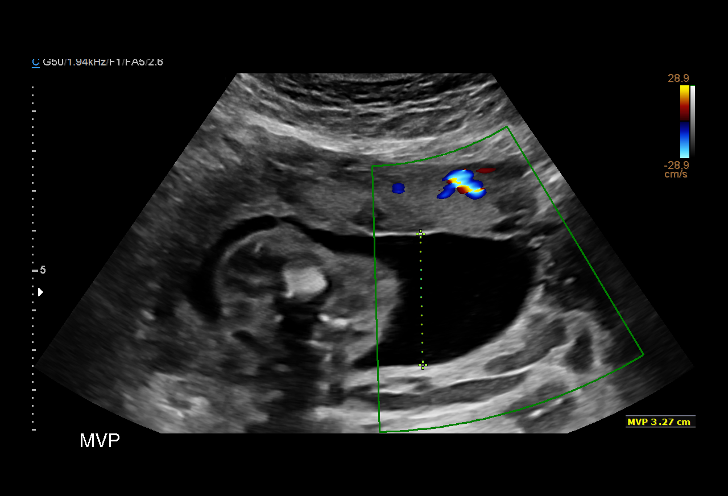
[im 25/96]
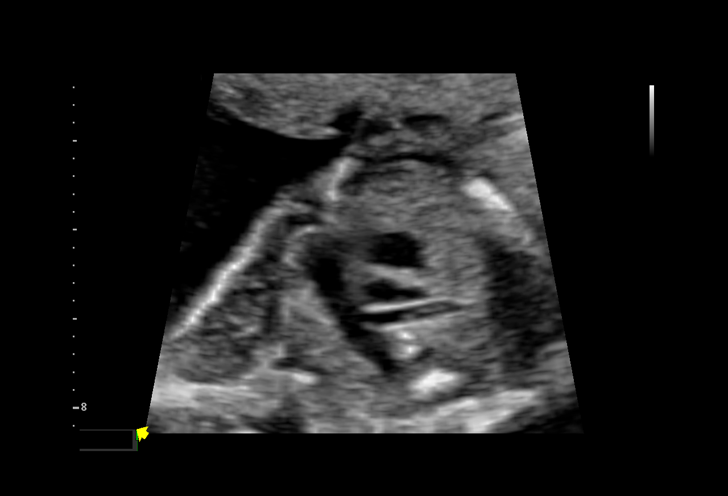
[im 32/96]
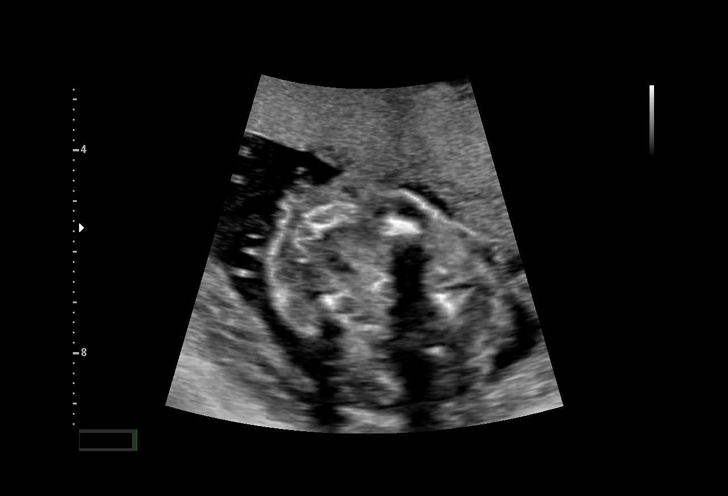
[im 39/96]
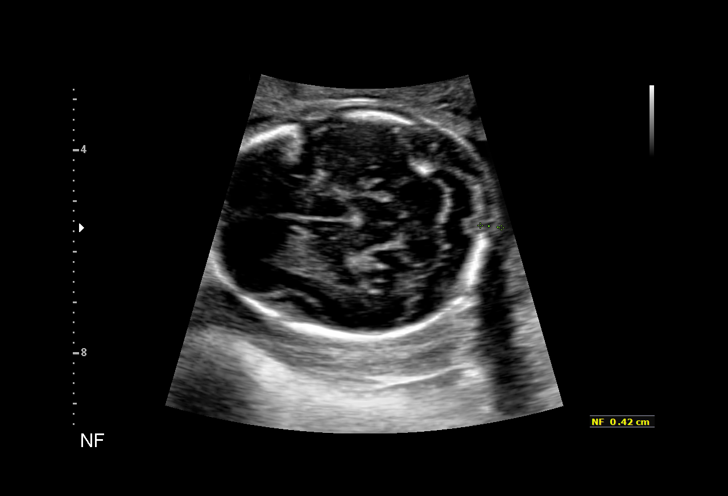
[im 50/96]
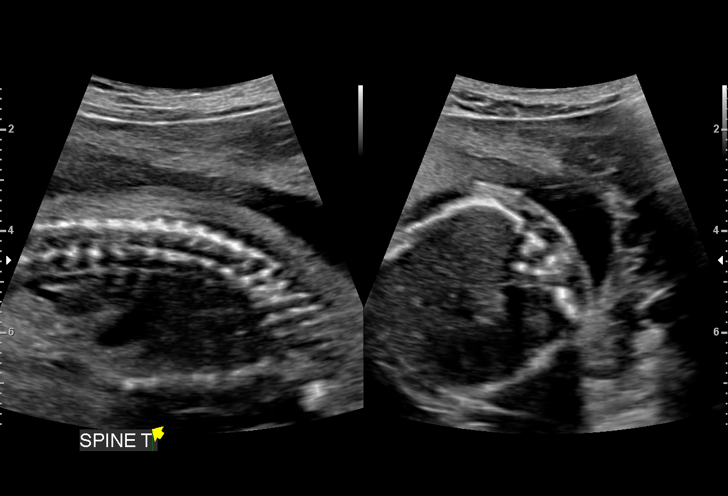
[im 57/96]
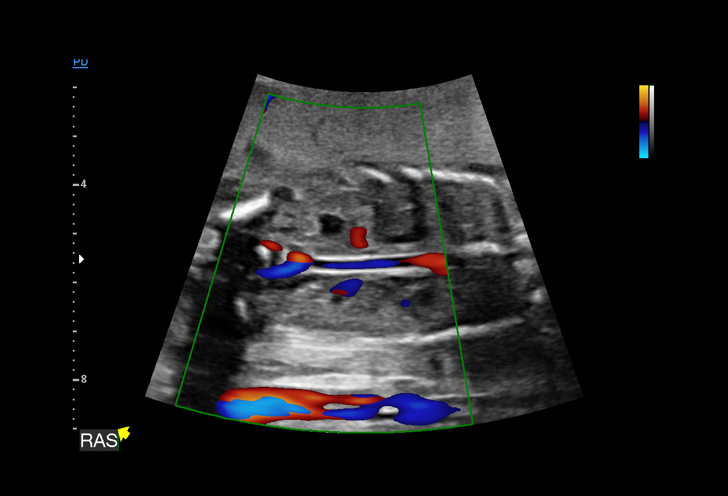
[im 64/96]
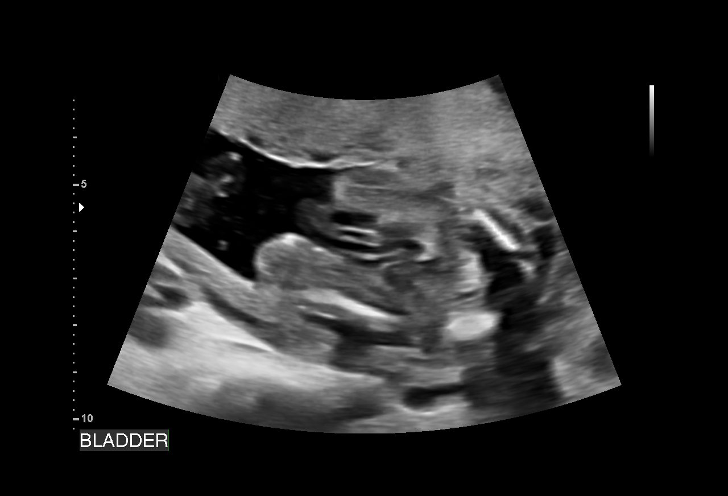
[im 71/96]
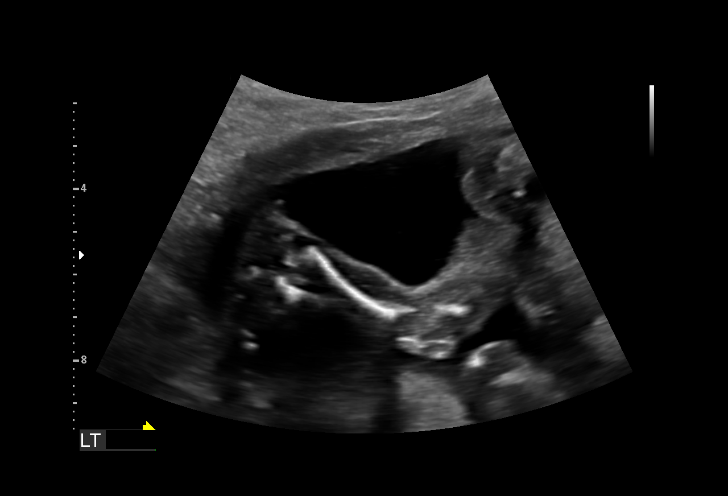
[im 78/96]
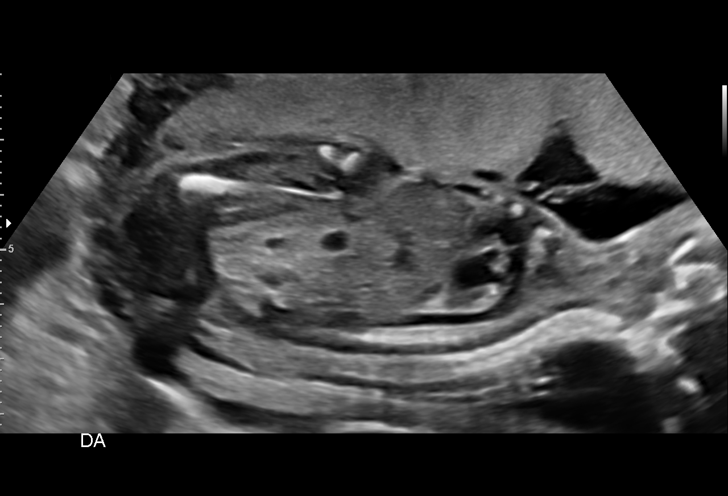
[im 85/96]
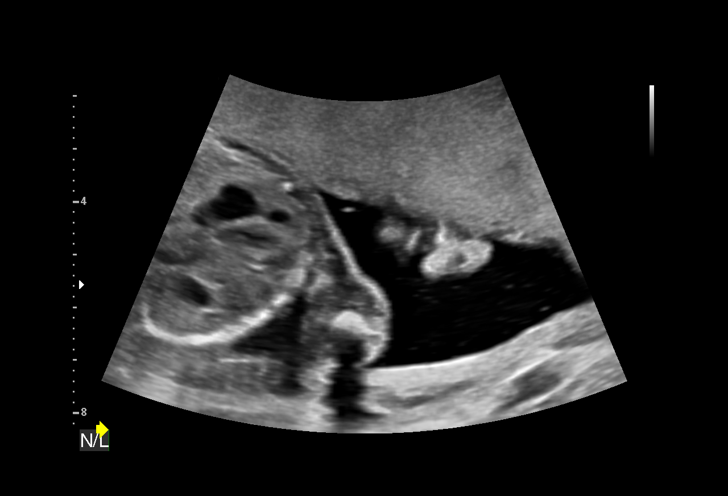
[im 92/96]
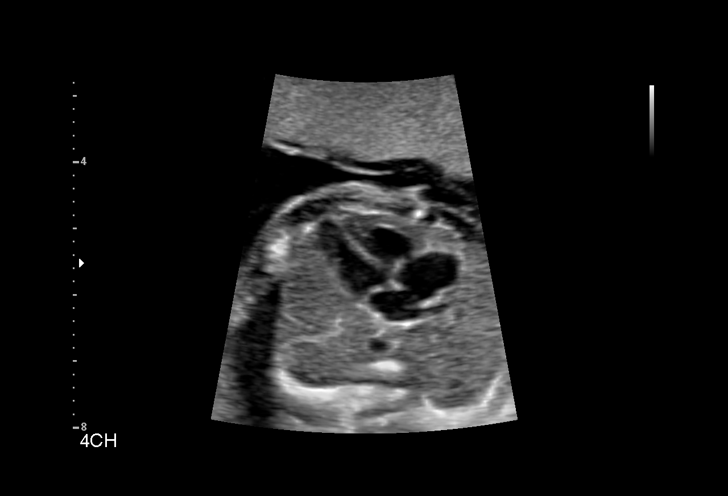

[13 of 28 positions shown; findings below may reference images not displayed]

----------------------------------------------------------------------

 ----------------------------------------------------------------------
Indications

  19 weeks gestation of pregnancy
  Encounter for antenatal screening for
  malformations (no testing in chart)
  Poor obstetric history: Previous fetal growth
  restriction (FGR)
  Family history of genetic disorder ([place]
  syndrome and hypermobility)
 ----------------------------------------------------------------------
Fetal Evaluation

 Num Of Fetuses:          1
 Fetal Heart Rate(bpm):   148
 Cardiac Activity:        Observed
 Presentation:            Cephalic
 Placenta:                Anterior
 P. Cord Insertion:       Visualized, central

 Amniotic Fluid
 AFI FV:      Within normal limits

                             Largest Pocket(cm)

Biometry

 BPD:      44.5  mm     G. Age:  19w 3d         33  %    CI:          74.3  %    70 - 86
                                                         FL/HC:       18.1  %    16.8 -
 HC:      163.9  mm     G. Age:  19w 1d         14  %    HC/AC:       1.15       1.09 -
 AC:      142.9  mm     G. Age:  19w 4d         38  %    FL/BPD:      66.5  %
 FL:       29.6  mm     G. Age:  19w 1d         19  %    FL/AC:       20.7  %    20 - 24
 HUM:      29.1  mm     G. Age:  19w 3d         43  %
 CER:      18.4  mm     G. Age:  18w 1d        < 5  %
 NFT:       4.2  mm
 CM:          6  mm
 Est. FW:     289   gm   0 lb 10 oz      39  %
OB History

 Gravidity:    3         Term:   1        Prem:   0        SAB:   1
 TOP:          0       Ectopic:  0        Living: 1
Gestational Age

 LMP:           19w 6d        Date:  08/06/18                 EDD:   05/13/19
 U/S Today:     19w 2d                                        EDD:   05/17/19
 Best:          19w 6d     Det. By:  LMP  (08/06/18)          EDD:   05/13/19
Anatomy

 Cranium:               Appears normal         LVOT:                   Appears normal
 Cavum:                 Appears normal         Aortic Arch:            Appears normal
 Ventricles:            Appears normal         Ductal Arch:            Appears normal
 Choroid Plexus:        Appears normal         Diaphragm:              Appears normal
 Cerebellum:            Appears normal         Stomach:                Appears normal, left
                                                                       sided
 Posterior Fossa:       Appears normal         Abdomen:                Appears normal
 Nuchal Fold:           Appears normal         Abdominal Wall:         Appears nml (cord
                                                                       insert, abd wall)
 Face:                  Appears normal         Cord Vessels:           Appears normal (3
                        (orbits and profile)                           vessel cord)
 Lips:                  Appears normal         Kidneys:                Appear normal
 Palate:                Appears normal         Bladder:                Appears normal
 Thoracic:              Appears normal         Spine:                  Appears normal
 Heart:                 Appears normal         Upper Extremities:      Appears normal
                        (4CH, axis, and
                        situs)
 RVOT:                  Appears normal         Lower Extremities:      Appears normal

 Other:  Parents do not wish to know sex of fetus. 3VV visualized. Technically
         difficult due to fetal position. Heels and 5th digit visualized.
Cervix Uterus Adnexa

 Cervix
 Length:            3.3  cm.
 Normal appearance by transabdominal scan.

 Uterus
 No abnormality visualized.

 Left Ovary
 No adnexal mass visualized.

 Right Ovary
 No adnexal mass visualized.

 Cul De Sac
 No free fluid seen.

 Adnexa
 No abnormality visualized.
Impression

 Normal interval growth.  No ultrasonic evidence of structural
 fetal anomalies.
 Prior history of IUGR
Recommendations

 Follow up growth at 32 weeks gestation.

## 2021-03-02 IMAGING — US US EXTREM LOW VENOUS*R*
1 series · 13 of 24 positions shown · non-contrast
Comparison: None.

CLINICAL DATA: 28-year-old female with a history lower extremity
swelling



[Series 1: us extrem low venous*right* · 13 of 30 slices shown]
[im 1/30]
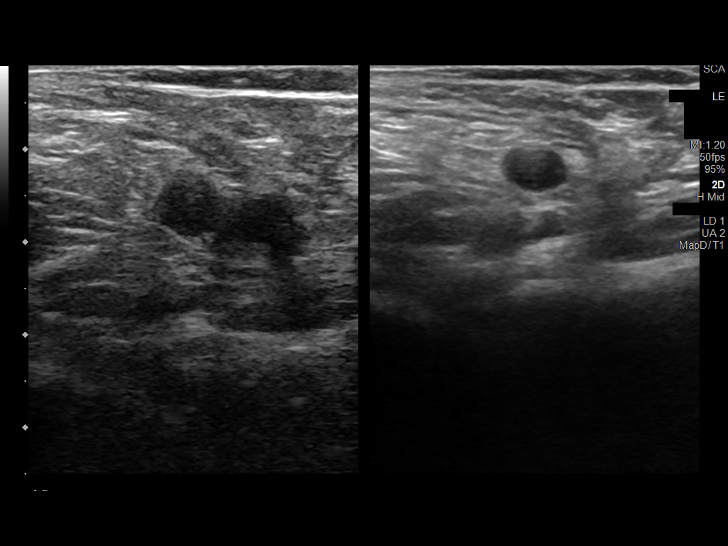
[im 3/30]
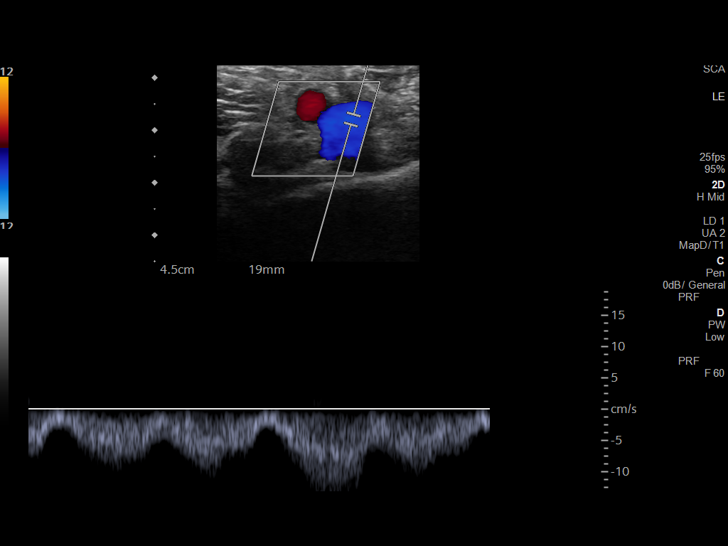
[im 6/30]
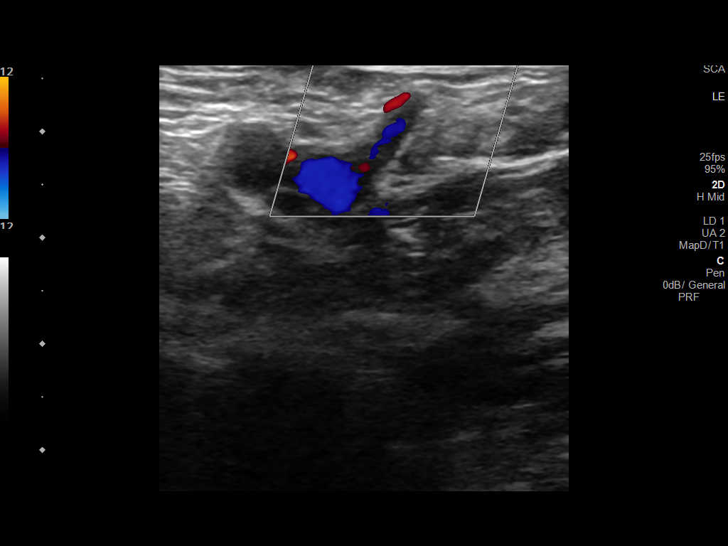
[im 8/30]
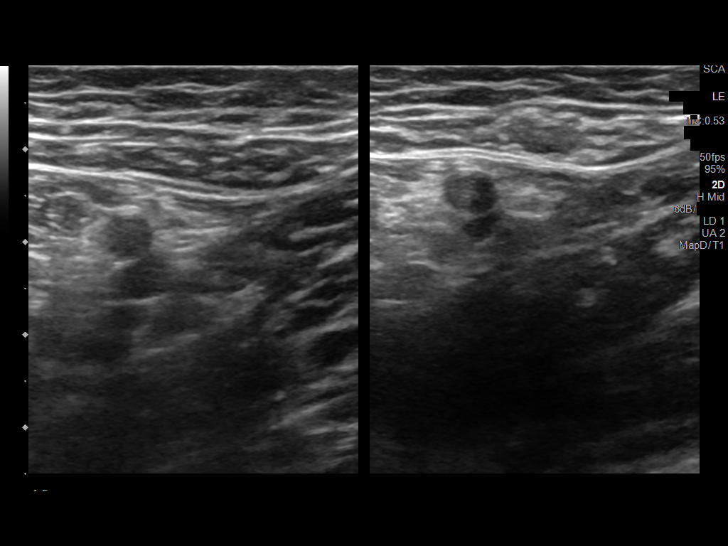
[im 11/30]
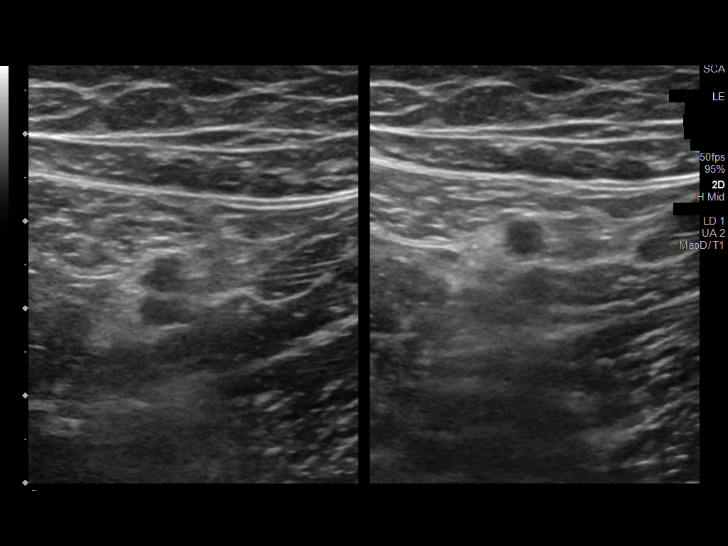
[im 13/30]
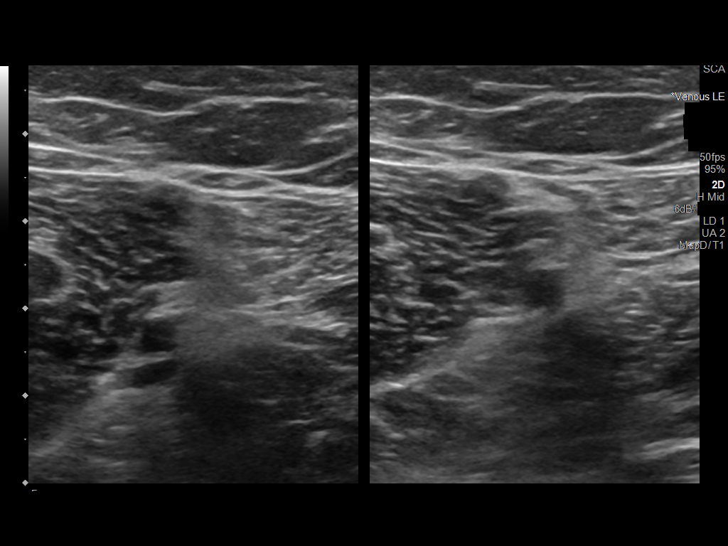
[im 16/30]
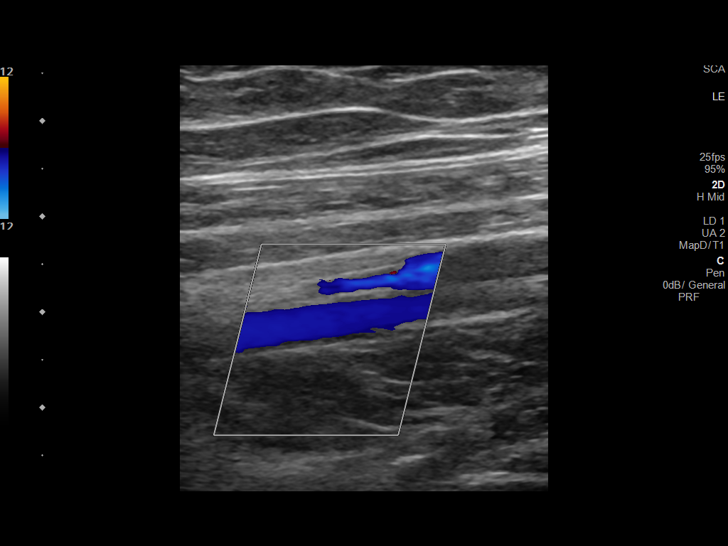
[im 17/30]
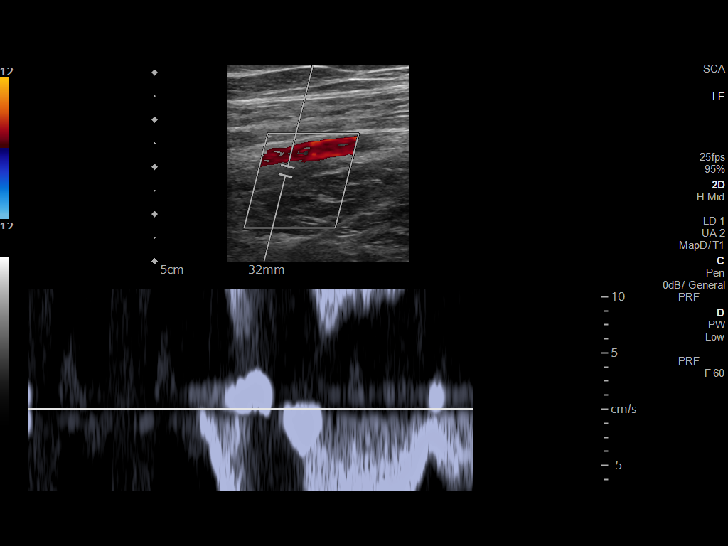
[im 19/30]
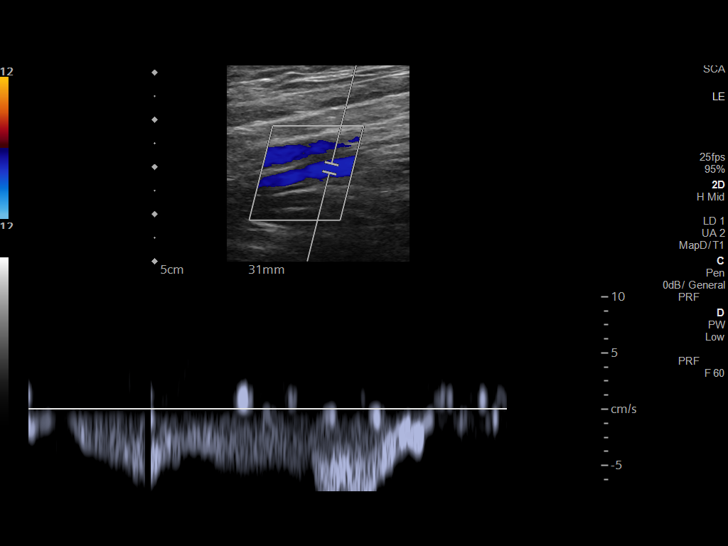
[im 22/30]
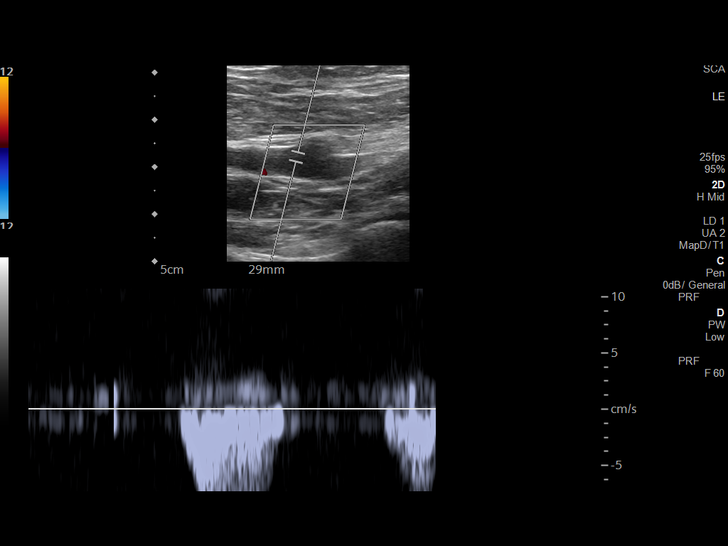
[im 24/30]
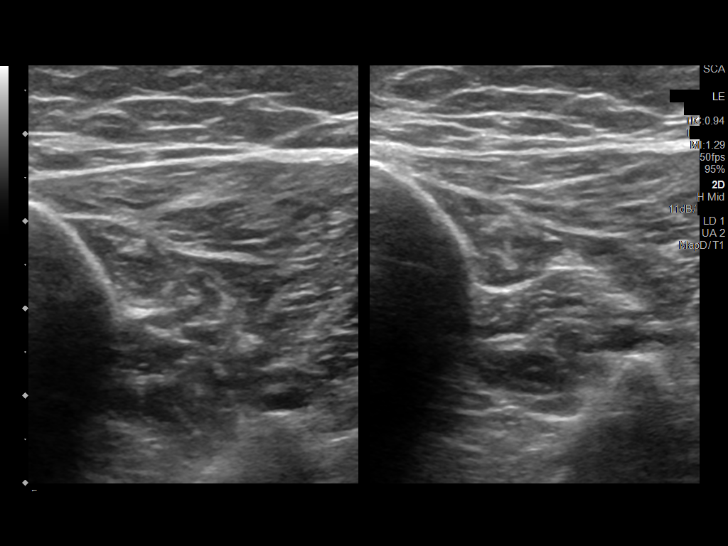
[im 27/30]
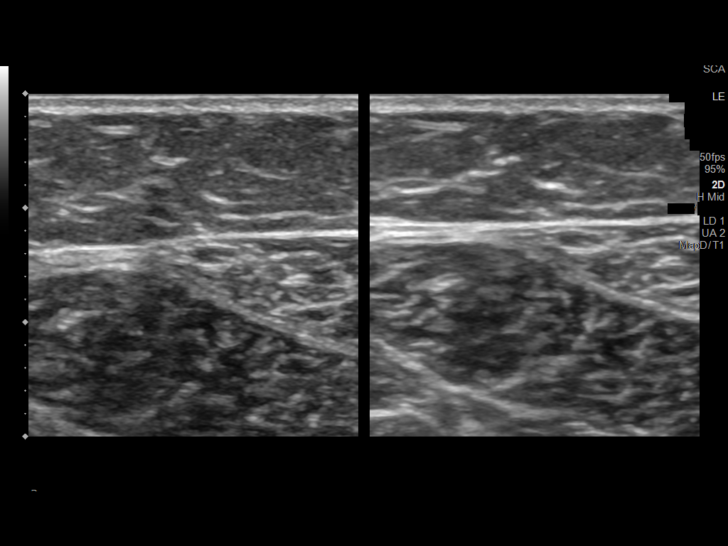
[im 30/30]
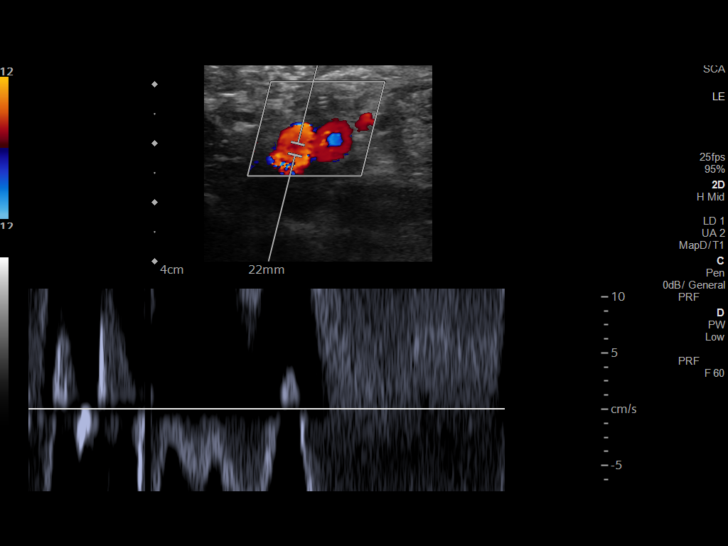

[13 of 24 positions shown; findings below may reference images not displayed]

FINDINGS: Contralateral Common Femoral Vein: Respiratory phasicity is normal
and symmetric with the symptomatic side. No evidence of thrombus.
Normal compressibility.

Common Femoral Vein: No evidence of thrombus. Normal
compressibility, respiratory phasicity and response to augmentation.

Saphenofemoral Junction: No evidence of thrombus. Normal
compressibility and flow on color Doppler imaging.

Profunda Femoral Vein: No evidence of thrombus. Normal
compressibility and flow on color Doppler imaging.

Femoral Vein: No evidence of thrombus. Normal compressibility,
respiratory phasicity and response to augmentation.

Popliteal Vein: No evidence of thrombus. Normal compressibility,
respiratory phasicity and response to augmentation.

Calf Veins: No evidence of thrombus. Normal compressibility and flow
on color Doppler imaging.

Superficial Great Saphenous Vein: No evidence of thrombus. Normal
compressibility and flow on color Doppler imaging.

Other Findings:  None.
IMPRESSION: Sonographic survey of the right lower extremity negative for DVT
